# Patient Record
Sex: Female | Born: 1955 | Race: White | Hispanic: No | Marital: Single | Smoking: Never smoker
Health system: Southern US, Community
[De-identification: ages and names within clinical notes are randomized; demographics above are authoritative.]

## PROBLEM LIST (undated history)

## (undated) DIAGNOSIS — R011 Cardiac murmur, unspecified: Secondary | ICD-10-CM

## (undated) DIAGNOSIS — T7840XA Allergy, unspecified, initial encounter: Secondary | ICD-10-CM

## (undated) HISTORY — PX: COLONOSCOPY: SHX174

## (undated) HISTORY — DX: Cardiac murmur, unspecified: R01.1

## (undated) HISTORY — PX: LEEP: SHX91

## (undated) HISTORY — DX: Allergy, unspecified, initial encounter: T78.40XA

---

## 2004-03-26 ENCOUNTER — Ambulatory Visit: Payer: Self-pay | Admitting: Unknown Physician Specialty

## 2005-04-03 ENCOUNTER — Ambulatory Visit: Payer: Self-pay | Admitting: Unknown Physician Specialty

## 2006-04-15 ENCOUNTER — Ambulatory Visit: Payer: Self-pay | Admitting: Unknown Physician Specialty

## 2006-05-29 ENCOUNTER — Ambulatory Visit: Payer: Self-pay | Admitting: General Surgery

## 2007-04-20 ENCOUNTER — Ambulatory Visit: Payer: Self-pay | Admitting: Unknown Physician Specialty

## 2008-04-21 ENCOUNTER — Ambulatory Visit: Payer: Self-pay | Admitting: Unknown Physician Specialty

## 2009-05-09 ENCOUNTER — Ambulatory Visit: Payer: Self-pay | Admitting: Unknown Physician Specialty

## 2010-05-29 ENCOUNTER — Ambulatory Visit: Payer: Self-pay | Admitting: Unknown Physician Specialty

## 2010-05-30 ENCOUNTER — Ambulatory Visit: Payer: Self-pay | Admitting: Unknown Physician Specialty

## 2010-12-24 ENCOUNTER — Ambulatory Visit: Payer: Self-pay | Admitting: Unknown Physician Specialty

## 2011-08-13 ENCOUNTER — Ambulatory Visit: Payer: Self-pay | Admitting: General Surgery

## 2011-12-30 ENCOUNTER — Ambulatory Visit: Payer: Self-pay | Admitting: Family Medicine

## 2013-01-04 ENCOUNTER — Ambulatory Visit: Payer: Self-pay | Admitting: Family Medicine

## 2013-06-21 ENCOUNTER — Ambulatory Visit: Payer: Self-pay | Admitting: Family Medicine

## 2013-12-23 ENCOUNTER — Ambulatory Visit: Payer: Self-pay | Admitting: Nurse Practitioner

## 2014-02-08 ENCOUNTER — Ambulatory Visit: Payer: Self-pay | Admitting: Nurse Practitioner

## 2015-03-15 ENCOUNTER — Other Ambulatory Visit: Payer: Self-pay | Admitting: Unknown Physician Specialty

## 2017-08-27 ENCOUNTER — Other Ambulatory Visit: Payer: Self-pay | Admitting: Internal Medicine

## 2017-09-01 ENCOUNTER — Encounter: Payer: Self-pay | Admitting: Internal Medicine

## 2017-09-01 ENCOUNTER — Encounter (INDEPENDENT_AMBULATORY_CARE_PROVIDER_SITE_OTHER): Payer: Self-pay

## 2017-09-01 ENCOUNTER — Ambulatory Visit: Payer: 59 | Admitting: Internal Medicine

## 2017-09-01 VITALS — BP 114/68 | HR 69 | Resp 16 | Ht 65.0 in | Wt 146.0 lb

## 2017-09-01 DIAGNOSIS — R300 Dysuria: Secondary | ICD-10-CM

## 2017-09-01 DIAGNOSIS — Z0000 Encounter for general adult medical examination without abnormal findings: Secondary | ICD-10-CM

## 2017-09-01 DIAGNOSIS — Z119 Encounter for screening for infectious and parasitic diseases, unspecified: Secondary | ICD-10-CM | POA: Diagnosis not present

## 2017-09-01 DIAGNOSIS — M810 Age-related osteoporosis without current pathological fracture: Secondary | ICD-10-CM | POA: Diagnosis not present

## 2017-09-01 DIAGNOSIS — Z1239 Encounter for other screening for malignant neoplasm of breast: Secondary | ICD-10-CM

## 2017-09-01 DIAGNOSIS — Z1231 Encounter for screening mammogram for malignant neoplasm of breast: Secondary | ICD-10-CM | POA: Diagnosis not present

## 2017-09-01 MED ORDER — ALENDRONATE SODIUM 70 MG PO TABS: 70.0000 mg | ORAL_TABLET | ORAL | 0 refills | Status: DC

## 2017-09-01 MED ORDER — ALENDRONATE SODIUM 70 MG PO TABS: 70.0000 mg | ORAL_TABLET | ORAL | 3 refills | Status: DC

## 2017-09-01 NOTE — Progress Notes (Signed)
0

## 2017-09-01 NOTE — Progress Notes (Signed)
[facility] [address] [place], [place] [address]  Internal MEDICINE  Office Visit Note  Patient Name: Mary West  [id number]  [id number]  Date of Service: 09/01/2017  Chief Complaint  Patient presents with  . Annual Exam     HPI Pt here for routine health screening.  She denies complaints at this time.  Pt only takes fosamax, enjoys good health. She is active, and still works.   Current Medication: Outpatient Encounter Medications as of 09/01/2017  Medication Sig  . alendronate (FOSAMAX) 70 MG tablet Take 1 tablet (70 mg total) by mouth once a week. Take with a full glass of water on an empty stomach.  . [DISCONTINUED] alendronate (FOSAMAX) 70 MG tablet TAKE 1 TABLET(S) BY MOUTH ONCE A WEEK WITH EMPTY STOMACH   No facility-administered encounter medications on file as of 09/01/2017.     Surgical History: Past Surgical History:  Procedure Laterality Date  . COLONOSCOPY      Medical History: Past Medical History:  Diagnosis Date  . Allergy   . Heart murmur     Family History: Family History  Problem Relation Age of Onset  . Breast cancer Mother   . Colon cancer Mother   . COPD Father    Review of Systems  Constitutional: Negative for chills, diaphoresis and fatigue.  HENT: Negative for ear pain, postnasal drip and sinus pressure.   Eyes: Negative for photophobia, discharge, redness, itching and visual disturbance.  Respiratory: Negative for cough, shortness of breath and wheezing.   Cardiovascular: Negative for chest pain, palpitations and leg swelling.  Gastrointestinal: Negative for abdominal pain, constipation, diarrhea, nausea and vomiting.  Genitourinary: Negative for dysuria and flank pain.  Musculoskeletal: Negative for arthralgias, back pain, gait problem and neck pain.  Skin: Negative for color change.  Allergic/Immunologic: Negative for environmental allergies and food allergies.  Neurological: Negative for dizziness and  headaches.  Hematological: Does not bruise/bleed easily.  Psychiatric/Behavioral: Negative for agitation, behavioral problems (depression) and hallucinations.   Vital Signs: BP 114/68   Pulse 69   Resp 16   Ht 5\' 5"  (1.651 m)   Wt 146 lb (66.2 kg)   SpO2 98%   BMI 24.30 kg/m  Physical Exam  Constitutional: She is oriented to person, place, and time. She appears well-developed and well-nourished. No distress.  HENT:  Head: Normocephalic and atraumatic.  Mouth/Throat: Oropharynx is clear and moist. No oropharyngeal exudate.  Eyes: Pupils are equal, round, and reactive to light. EOM are normal.  Neck: Normal range of motion. Neck supple. No JVD present. No tracheal deviation present. No thyromegaly present.  Cardiovascular: Normal rate, regular rhythm and normal heart sounds. Exam reveals no gallop and no friction rub.  No murmur heard. Pulmonary/Chest: Effort normal. No respiratory distress. She has no wheezes. She has no rales. She exhibits no tenderness. Right breast exhibits no inverted nipple and no mass. Left breast exhibits no inverted nipple and no mass. Breasts are symmetrical.  Abdominal: Soft. Bowel sounds are normal.  Musculoskeletal: Normal range of motion.  Lymphadenopathy:    She has no cervical adenopathy.  Neurological: She is alert and oriented to person, place, and time. No cranial nerve deficit.  Skin: Skin is warm and dry. She is not diaphoretic.  Psychiatric: She has a normal mood and affect. Her behavior is normal. Judgment and thought content normal.   Assessment/Plan: 1. Annual physical exam Update labs, bone density and mammogram.  - CBC with Differential/Platelet - Lipid Panel With LDL/HDL Ratio -  TSH - T4, free - Comprehensive metabolic panel  2. Dysuria - UA/M w/rflx Culture, Routine  3. Breast cancer screening - MM 3D SCREEN BREAST BILATERAL; Future  4. Senile osteoporosis Continue fosomax as before.  - DG Bone Density; Future  5. Screening  examination for infectious disease - HCV RNA quant  General Counseling: Nettie ElmSylvia verbalizes understanding of the findings of todays visit and agrees with plan of treatment. I have discussed any further diagnostic evaluation that may be needed or ordered today. We also reviewed her medications today. she has been encouraged to call the office with any questions or concerns that should arise related to todays visit.    Orders Placed This Encounter  Procedures  . DG Bone Density  . MM 3D SCREEN BREAST BILATERAL  . UA/M w/rflx Culture, Routine  . CBC with Differential/Platelet  . Lipid Panel With LDL/HDL Ratio  . TSH  . T4, free  . Comprehensive metabolic panel  . HCV RNA quant    Meds ordered this encounter  Medications  . alendronate (FOSAMAX) 70 MG tablet    Sig: Take 1 tablet (70 mg total) by mouth once a week. Take with a full glass of water on an empty stomach.    Dispense:  12 tablet    Refill:  0    Time spent: 7930 Minutes  Lyndon CodeFozia M Jarone Ostergaard, MD  Internal Medicine

## 2017-09-02 LAB — SPECIMEN STATUS REPORT

## 2017-09-04 LAB — UA/M W/RFLX CULTURE, ROUTINE
BILIRUBIN UA: NEGATIVE
Glucose, UA: NEGATIVE
Ketones, UA: NEGATIVE
LEUKOCYTES UA: NEGATIVE
Nitrite, UA: NEGATIVE
PH UA: 6 (ref 5.0–7.5)
Protein, UA: NEGATIVE
RBC, UA: NEGATIVE
SPEC GRAV UA: 1.013 (ref 1.005–1.030)
Urobilinogen, Ur: 0.2 mg/dL (ref 0.2–1.0)

## 2017-09-04 LAB — COMPREHENSIVE METABOLIC PANEL
ALT: 22 IU/L (ref 0–32)
AST: 24 IU/L (ref 0–40)
Albumin/Globulin Ratio: 2 (ref 1.2–2.2)
Albumin: 4.5 g/dL (ref 3.6–4.8)
Alkaline Phosphatase: 75 IU/L (ref 39–117)
BUN/Creatinine Ratio: 10 — ABNORMAL LOW (ref 12–28)
BUN: 8 mg/dL (ref 8–27)
Bilirubin Total: 0.4 mg/dL (ref 0.0–1.2)
CALCIUM: 9.1 mg/dL (ref 8.7–10.3)
CO2: 23 mmol/L (ref 20–29)
CREATININE: 0.78 mg/dL (ref 0.57–1.00)
Chloride: 104 mmol/L (ref 96–106)
GFR calc non Af Amer: 82 mL/min/{1.73_m2} (ref >59)
GFR, EST AFRICAN AMERICAN: 95 mL/min/{1.73_m2} (ref >59)
GLUCOSE: 89 mg/dL (ref 65–99)
Globulin, Total: 2.2 g/dL (ref 1.5–4.5)
Potassium: 4.5 mmol/L (ref 3.5–5.2)
Sodium: 143 mmol/L (ref 134–144)
Total Protein: 6.7 g/dL (ref 6.0–8.5)

## 2017-09-04 LAB — CBC WITH DIFFERENTIAL/PLATELET
BASOS: 1 %
Basophils Absolute: 0 10*3/uL (ref 0.0–0.2)
EOS (ABSOLUTE): 0.1 10*3/uL (ref 0.0–0.4)
Eos: 3 %
Hematocrit: 42.7 % (ref 34.0–46.6)
Hemoglobin: 14.7 g/dL (ref 11.1–15.9)
IMMATURE GRANULOCYTES: 0 %
Immature Grans (Abs): 0 10*3/uL (ref 0.0–0.1)
Lymphocytes Absolute: 0.9 10*3/uL (ref 0.7–3.1)
Lymphs: 23 %
MCH: 33.2 pg — ABNORMAL HIGH (ref 26.6–33.0)
MCHC: 34.4 g/dL (ref 31.5–35.7)
MCV: 96 fL (ref 79–97)
MONOS ABS: 0.3 10*3/uL (ref 0.1–0.9)
Monocytes: 9 %
NEUTROS PCT: 64 %
Neutrophils Absolute: 2.4 10*3/uL (ref 1.4–7.0)
PLATELETS: 268 10*3/uL (ref 150–450)
RBC: 4.43 x10E6/uL (ref 3.77–5.28)
RDW: 12.5 % (ref 12.3–15.4)
WBC: 3.7 10*3/uL (ref 3.4–10.8)

## 2017-09-04 LAB — LIPID PANEL WITH LDL/HDL RATIO
Cholesterol, Total: 180 mg/dL (ref 100–199)
HDL: 53 mg/dL (ref >39)
LDL Calculated: 111 mg/dL — ABNORMAL HIGH (ref 0–99)
LDl/HDL Ratio: 2.1 ratio (ref 0.0–3.2)
Triglycerides: 81 mg/dL (ref 0–149)
VLDL Cholesterol Cal: 16 mg/dL (ref 5–40)

## 2017-09-04 LAB — MICROSCOPIC EXAMINATION
BACTERIA UA: NONE SEEN
Casts: NONE SEEN /lpf

## 2017-09-04 LAB — T4, FREE: FREE T4: 1.19 ng/dL (ref 0.82–1.77)

## 2017-09-04 LAB — HCV RNA QUANT: Hepatitis C Quantitation: NOT DETECTED IU/mL

## 2017-09-04 LAB — TSH: TSH: 1.38 u[IU]/mL (ref 0.450–4.500)

## 2017-09-09 NOTE — Progress Notes (Signed)
Labs look good.

## 2017-09-22 ENCOUNTER — Ambulatory Visit
Admission: RE | Admit: 2017-09-22 | Discharge: 2017-09-22 | Disposition: A | Discharge status: Home / Self Care | Payer: 59 | Source: Ambulatory Visit | Attending: Internal Medicine | Admitting: Internal Medicine

## 2017-09-22 ENCOUNTER — Encounter: Payer: Self-pay | Admitting: Radiology

## 2017-09-22 DIAGNOSIS — M810 Age-related osteoporosis without current pathological fracture: Secondary | ICD-10-CM | POA: Insufficient documentation

## 2017-09-22 DIAGNOSIS — Z1231 Encounter for screening mammogram for malignant neoplasm of breast: Secondary | ICD-10-CM | POA: Insufficient documentation

## 2017-09-22 DIAGNOSIS — Z1239 Encounter for other screening for malignant neoplasm of breast: Secondary | ICD-10-CM

## 2017-09-30 ENCOUNTER — Ambulatory Visit (INDEPENDENT_AMBULATORY_CARE_PROVIDER_SITE_OTHER): Payer: 59 | Admitting: Adult Health

## 2017-09-30 ENCOUNTER — Encounter: Payer: Self-pay | Admitting: Adult Health

## 2017-09-30 VITALS — BP 104/68 | HR 62 | Temp 98.6°F | Resp 16 | Ht 65.0 in | Wt 145.8 lb

## 2017-09-30 DIAGNOSIS — B373 Candidiasis of vulva and vagina: Secondary | ICD-10-CM

## 2017-09-30 DIAGNOSIS — B3731 Acute candidiasis of vulva and vagina: Secondary | ICD-10-CM

## 2017-09-30 DIAGNOSIS — R011 Cardiac murmur, unspecified: Secondary | ICD-10-CM | POA: Insufficient documentation

## 2017-09-30 DIAGNOSIS — N390 Urinary tract infection, site not specified: Secondary | ICD-10-CM | POA: Diagnosis not present

## 2017-09-30 DIAGNOSIS — R300 Dysuria: Secondary | ICD-10-CM

## 2017-09-30 DIAGNOSIS — J302 Other seasonal allergic rhinitis: Secondary | ICD-10-CM | POA: Insufficient documentation

## 2017-09-30 LAB — POCT URINALYSIS DIPSTICK
BILIRUBIN UA: NEGATIVE
GLUCOSE UA: NEGATIVE
KETONES UA: NEGATIVE
Nitrite, UA: NEGATIVE
Protein, UA: POSITIVE — AB
Spec Grav, UA: 1.01 (ref 1.010–1.025)
UROBILINOGEN UA: 0.2 U/dL
pH, UA: 7.5 (ref 5.0–8.0)

## 2017-09-30 MED ORDER — FLUCONAZOLE 150 MG PO TABS: 150.0000 mg | ORAL_TABLET | Freq: Every day | ORAL | 1 refills | Status: DC

## 2017-09-30 MED ORDER — NITROFURANTOIN MONOHYD MACRO 100 MG PO CAPS: 100.0000 mg | ORAL_CAPSULE | Freq: Two times a day (BID) | ORAL | 0 refills | Status: DC

## 2017-09-30 NOTE — Progress Notes (Signed)
[facility] [address] [place], [place] [address]  Internal MEDICINE  Office Visit Note  Patient Name: Mary West  [id number]  [id number]  Date of Service: 09/30/2017  Chief Complaint  Patient presents with  . Urinary Tract Infection    pressure and burning, been going on since yesterday and ongoing today     Urinary Tract Infection   This is a new problem. The current episode started yesterday. The problem has been unchanged. The quality of the pain is described as aching and burning. The pain is mild. There has been no fever. There is no history of pyelonephritis. Associated symptoms include a discharge. Pertinent negatives include no chills, flank pain, frequency, hematuria, nausea, urgency or vomiting. She has tried increased fluids for the symptoms.   Pt is here for a sick visit.   Current Medication:  Outpatient Encounter Medications as of 09/30/2017  Medication Sig  . alendronate (FOSAMAX) 70 MG tablet Take 1 tablet (70 mg total) by mouth once a week. Take with a full glass of water on an empty stomach.   No facility-administered encounter medications on file as of 09/30/2017.       Medical History: Past Medical History:  Diagnosis Date  . Allergy   . Heart murmur      Vital Signs: BP 104/68 (BP Location: Left Arm, Patient Position: Sitting, Cuff Size: Normal)   Pulse 62   Temp 98.6 F (37 C)   Resp 16   Ht 5\' 5"  (1.651 m)   Wt 145 lb 12.8 oz (66.1 kg)   SpO2 98%   BMI 24.26 kg/m    Review of Systems  Constitutional: Negative for chills, fatigue and unexpected weight change.  HENT: Negative for congestion, rhinorrhea, sneezing and sore throat.   Eyes: Negative for photophobia, pain and redness.  Respiratory: Negative for cough, chest tightness and shortness of breath.   Cardiovascular: Negative for chest pain and palpitations.  Gastrointestinal: Negative for abdominal pain, constipation, diarrhea, nausea and vomiting.   Endocrine: Negative.   Genitourinary: Negative for dysuria, flank pain, frequency, hematuria and urgency.  Musculoskeletal: Negative for arthralgias, back pain, joint swelling and neck pain.  Skin: Negative for rash.  Allergic/Immunologic: Negative.   Neurological: Negative for tremors and numbness.  Hematological: Negative for adenopathy. Does not bruise/bleed easily.  Psychiatric/Behavioral: Negative for behavioral problems and sleep disturbance. The patient is not nervous/anxious.    Physical Exam  Constitutional: She is oriented to person, place, and time. She appears well-developed and well-nourished. No distress.  HENT:  Head: Normocephalic and atraumatic.  Mouth/Throat: Oropharynx is clear and moist. No oropharyngeal exudate.  Eyes: Pupils are equal, round, and reactive to light. EOM are normal.  Neck: Normal range of motion. Neck supple. No JVD present. No tracheal deviation present. No thyromegaly present.  Cardiovascular: Normal rate, regular rhythm and normal heart sounds. Exam reveals no gallop and no friction rub.  No murmur heard. Pulmonary/Chest: Effort normal and breath sounds normal. No respiratory distress. She has no wheezes. She has no rales. She exhibits no tenderness.  Abdominal: Soft. There is no tenderness. There is no guarding.  Musculoskeletal: Normal range of motion.  Lymphadenopathy:    She has no cervical adenopathy.  Neurological: She is alert and oriented to person, place, and time. No cranial nerve deficit.  Skin: Skin is warm and dry. She is not diaphoretic.  Psychiatric: She has a normal mood and affect. Her behavior is normal. Judgment and thought content normal.  Nursing note  and vitals reviewed.  Assessment/Plan: 1. Urinary tract infection without hematuria, site unspecified - nitrofurantoin, macrocrystal-monohydrate, (MACROBID) 100 MG capsule; Take 1 capsule (100 mg total) by mouth 2 (two) times daily.  Dispense: 20 capsule; Refill: 0  2.  Vagina, candidiasis - fluconazole (DIFLUCAN) 150 MG tablet; Take 1 tablet (150 mg total) by mouth daily. May repeat in 3 days.  Dispense: 3 tablet; Refill: 1  3. Dysuria - POCT Urinalysis Dipstick - CULTURE, URINE COMPREHENSIVE  General Counseling: Mary West verbalizes understanding of the findings of todays visit and agrees with plan of treatment. I have discussed any further diagnostic evaluation that may be needed or ordered today. We also reviewed her medications today. she has been encouraged to call the office with any questions or concerns that should arise related to todays visit.   Orders Placed This Encounter  Procedures  . CULTURE, URINE COMPREHENSIVE  . POCT Urinalysis Dipstick    No orders of the defined types were placed in this encounter.   Time spent: 25 Minutes  This patient was seen by Blima LedgerAdam Kentavious Michele AGNP-C in Collaboration with Dr Lyndon CodeFozia M Khan as a part of collaborative care agreement

## 2017-09-30 NOTE — Patient Instructions (Signed)

## 2017-10-01 ENCOUNTER — Encounter: Payer: Self-pay | Admitting: Adult Health

## 2017-10-02 LAB — CULTURE, URINE COMPREHENSIVE

## 2018-09-06 ENCOUNTER — Other Ambulatory Visit: Payer: Self-pay

## 2018-09-06 ENCOUNTER — Ambulatory Visit (INDEPENDENT_AMBULATORY_CARE_PROVIDER_SITE_OTHER): Payer: 59 | Admitting: Adult Health

## 2018-09-06 ENCOUNTER — Encounter: Payer: Self-pay | Admitting: Adult Health

## 2018-09-06 VITALS — BP 103/68 | HR 76 | Resp 16 | Ht 65.0 in | Wt 140.0 lb

## 2018-09-06 DIAGNOSIS — Z124 Encounter for screening for malignant neoplasm of cervix: Secondary | ICD-10-CM | POA: Diagnosis not present

## 2018-09-06 DIAGNOSIS — M810 Age-related osteoporosis without current pathological fracture: Secondary | ICD-10-CM | POA: Diagnosis not present

## 2018-09-06 DIAGNOSIS — R300 Dysuria: Secondary | ICD-10-CM

## 2018-09-06 DIAGNOSIS — N760 Acute vaginitis: Secondary | ICD-10-CM

## 2018-09-06 DIAGNOSIS — Z0001 Encounter for general adult medical examination with abnormal findings: Secondary | ICD-10-CM

## 2018-09-06 DIAGNOSIS — Z1239 Encounter for other screening for malignant neoplasm of breast: Secondary | ICD-10-CM

## 2018-09-06 MED ORDER — ALENDRONATE SODIUM 70 MG PO TABS: 70.0000 mg | ORAL_TABLET | ORAL | 0 refills | Status: DC

## 2018-09-06 NOTE — Progress Notes (Signed)
[facility] [address] [place], [place] [address]  Internal MEDICINE  Office Visit Note  Patient Name: Mary ReefSylvia Stephens West  [id number]/02/2055  [id number]  Date of Service: 09/06/2018  Chief Complaint  Patient presents with  . Annual Exam    medicare well visit , physical with pap      HPI Pt is here for routine health maintenance examination. Pt reports overall she is doing well. She takes Fosamax every other week, and denies any other medications beyond vitamins.  No tobacco or illicit drug use.  She does drink alcohol occasionally.     Complains of vaginal discharge that is typically 3 days after sex.   Current Medication: Outpatient Encounter Medications as of 09/06/2018  Medication Sig  . alendronate (FOSAMAX) 70 MG tablet Take 1 tablet (70 mg total) by mouth once a week. Take with a full glass of water on an empty stomach.  . [DISCONTINUED] fluconazole (DIFLUCAN) 150 MG tablet Take 1 tablet (150 mg total) by mouth daily. May repeat in 3 days. (Patient not taking: Reported on 09/06/2018)  . [DISCONTINUED] nitrofurantoin, macrocrystal-monohydrate, (MACROBID) 100 MG capsule Take 1 capsule (100 mg total) by mouth 2 (two) times daily. (Patient not taking: Reported on 09/06/2018)   No facility-administered encounter medications on file as of 09/06/2018.     Surgical History: Past Surgical History:  Procedure Laterality Date  . COLONOSCOPY      Medical History: Past Medical History:  Diagnosis Date  . Allergy   . Heart murmur     Family History: Family History  Problem Relation Age of Onset  . Breast cancer Mother   . Colon cancer Mother   . COPD Father       Review of Systems  Constitutional: Negative for chills, fatigue and unexpected weight change.  HENT: Negative for congestion, rhinorrhea, sneezing and sore throat.   Eyes: Negative for photophobia, pain and redness.  Respiratory: Negative for cough, chest tightness and shortness of breath.    Cardiovascular: Negative for chest pain and palpitations.  Gastrointestinal: Negative for abdominal pain, constipation, diarrhea, nausea and vomiting.  Endocrine: Negative.   Genitourinary: Negative for dysuria and frequency.  Musculoskeletal: Negative for arthralgias, back pain, joint swelling and neck pain.  Skin: Negative for rash.  Allergic/Immunologic: Negative.   Neurological: Negative for tremors and numbness.  Hematological: Negative for adenopathy. Does not bruise/bleed easily.  Psychiatric/Behavioral: Negative for behavioral problems and sleep disturbance. The patient is not nervous/anxious.      Vital Signs: BP 103/68   Pulse 76   Resp 16   Ht 5\' 5"  (1.651 m)   Wt 140 lb (63.5 kg)   SpO2 100%   BMI 23.30 kg/m    Physical Exam Vitals signs and nursing note reviewed. Exam conducted with a chaperone present.  Constitutional:      General: She is not in acute distress.    Appearance: She is well-developed. She is not diaphoretic.  HENT:     Head: Normocephalic and atraumatic.     Mouth/Throat:     Pharynx: No oropharyngeal exudate.  Eyes:     Pupils: Pupils are equal, round, and reactive to light.  Neck:     Musculoskeletal: Normal range of motion and neck supple.     Thyroid: No thyromegaly.     Vascular: No JVD.     Trachea: No tracheal deviation.  Cardiovascular:     Rate and Rhythm: Normal rate and regular rhythm.     Heart sounds: Normal heart  sounds. No murmur. No friction rub. No gallop.   Pulmonary:     Effort: Pulmonary effort is normal. No respiratory distress.     Breath sounds: Normal breath sounds. No wheezing or rales.  Chest:     Chest wall: No tenderness.     Breasts:        Right: Normal.      Comments: Exam Chaperoned by Dorna Bloom CMA Abdominal:     Palpations: Abdomen is soft.     Tenderness: There is no abdominal tenderness. There is no guarding.  Musculoskeletal: Normal range of motion.  Lymphadenopathy:     Cervical: No  cervical adenopathy.  Skin:    General: Skin is warm and dry.  Neurological:     Mental Status: She is alert and oriented to person, place, and time.     Cranial Nerves: No cranial nerve deficit.  Psychiatric:        Behavior: Behavior normal.        Thought Content: Thought content normal.        Judgment: Judgment normal.      LABS: No results found for this or any previous visit (from the past 2160 hour(s)).  Assessment/Plan: 1. Encounter for general adult medical examination with abnormal findings Up to date on PHM. - CBC with Differential/Platelet - Lipid Panel With LDL/HDL Ratio - TSH - T4, free - Comprehensive metabolic panel  2. Senile osteoporosis Continue fosamax as prescribed.   3. Routine cervical smear - Pap IG and HPV (high risk) DNA detection  4. Acute vaginitis - NuSwab Vaginitis Plus (VG+)  5. Dysuria - Urinalysis, Routine w reflex microscopic  6. Screening for breast cancer - MM DIGITAL SCREENING BILATERAL; Future  General Counseling: tahari clabaugh understanding of the findings of todays visit and agrees with plan of treatment. I have discussed any further diagnostic evaluation that may be needed or ordered today. We also reviewed her medications today. she has been encouraged to call the office with any questions or concerns that should arise related to todays visit.   Orders Placed This Encounter  Procedures  . Urinalysis, Routine w reflex microscopic  . NuSwab Vaginitis Plus (VG+)    No orders of the defined types were placed in this encounter.   Time spent: 35 Minutes   This patient was seen by Orson Gear AGNP-C in Collaboration with Dr Lavera Guise as a part of collaborative care agreement    Kendell Bane AGNP-C Internal Medicine

## 2018-09-07 LAB — URINALYSIS, ROUTINE W REFLEX MICROSCOPIC
Bilirubin, UA: NEGATIVE
Glucose, UA: NEGATIVE
Ketones, UA: NEGATIVE
Leukocytes,UA: NEGATIVE
Nitrite, UA: NEGATIVE
Protein,UA: NEGATIVE
RBC, UA: NEGATIVE
Specific Gravity, UA: 1.019 (ref 1.005–1.030)
Urobilinogen, Ur: 0.2 mg/dL (ref 0.2–1.0)
pH, UA: 5 (ref 5.0–7.5)

## 2018-09-08 LAB — COMPREHENSIVE METABOLIC PANEL
ALT: 18 IU/L (ref 0–32)
AST: 25 IU/L (ref 0–40)
Albumin/Globulin Ratio: 2 (ref 1.2–2.2)
Albumin: 4.4 g/dL (ref 3.8–4.8)
Alkaline Phosphatase: 82 IU/L (ref 39–117)
BUN/Creatinine Ratio: 13 (ref 12–28)
BUN: 11 mg/dL (ref 8–27)
Bilirubin Total: 0.6 mg/dL (ref 0.0–1.2)
CO2: 23 mmol/L (ref 20–29)
Calcium: 9.8 mg/dL (ref 8.7–10.3)
Chloride: 104 mmol/L (ref 96–106)
Creatinine, Ser: 0.86 mg/dL (ref 0.57–1.00)
GFR calc Af Amer: 84 mL/min/{1.73_m2} (ref >59)
GFR calc non Af Amer: 73 mL/min/{1.73_m2} (ref >59)
Globulin, Total: 2.2 g/dL (ref 1.5–4.5)
Glucose: 93 mg/dL (ref 65–99)
Potassium: 4.2 mmol/L (ref 3.5–5.2)
Sodium: 142 mmol/L (ref 134–144)
Total Protein: 6.6 g/dL (ref 6.0–8.5)

## 2018-09-08 LAB — CBC WITH DIFFERENTIAL/PLATELET
Basophils Absolute: 0 10*3/uL (ref 0.0–0.2)
Basos: 1 %
EOS (ABSOLUTE): 0.1 10*3/uL (ref 0.0–0.4)
Eos: 2 %
Hematocrit: 41.1 % (ref 34.0–46.6)
Hemoglobin: 14.4 g/dL (ref 11.1–15.9)
Immature Grans (Abs): 0 10*3/uL (ref 0.0–0.1)
Immature Granulocytes: 0 %
Lymphocytes Absolute: 0.9 10*3/uL (ref 0.7–3.1)
Lymphs: 22 %
MCH: 32.9 pg (ref 26.6–33.0)
MCHC: 35 g/dL (ref 31.5–35.7)
MCV: 94 fL (ref 79–97)
Monocytes Absolute: 0.4 10*3/uL (ref 0.1–0.9)
Monocytes: 10 %
Neutrophils Absolute: 2.7 10*3/uL (ref 1.4–7.0)
Neutrophils: 65 %
Platelets: 310 10*3/uL (ref 150–450)
RBC: 4.38 x10E6/uL (ref 3.77–5.28)
RDW: 10.9 % — ABNORMAL LOW (ref 11.7–15.4)
WBC: 4.1 10*3/uL (ref 3.4–10.8)

## 2018-09-08 LAB — T4, FREE: Free T4: 1.2 ng/dL (ref 0.82–1.77)

## 2018-09-08 LAB — LIPID PANEL WITH LDL/HDL RATIO
Cholesterol, Total: 170 mg/dL (ref 100–199)
HDL: 53 mg/dL (ref >39)
LDL Calculated: 101 mg/dL — ABNORMAL HIGH (ref 0–99)
LDl/HDL Ratio: 1.9 ratio (ref 0.0–3.2)
Triglycerides: 80 mg/dL (ref 0–149)
VLDL Cholesterol Cal: 16 mg/dL (ref 5–40)

## 2018-09-08 LAB — TSH: TSH: 1.58 u[IU]/mL (ref 0.450–4.500)

## 2018-09-13 LAB — NUSWAB VAGINITIS PLUS (VG+)
Candida albicans, NAA: NEGATIVE
Candida glabrata, NAA: NEGATIVE
Chlamydia trachomatis, NAA: NEGATIVE
Neisseria gonorrhoeae, NAA: NEGATIVE
Trich vag by NAA: NEGATIVE

## 2018-09-14 ENCOUNTER — Telehealth: Payer: Self-pay

## 2018-09-14 LAB — PAP IG AND HPV HIGH-RISK: HPV, high-risk: NEGATIVE

## 2018-09-17 NOTE — Telephone Encounter (Signed)
Her labs look good, nothing to worry about.

## 2018-09-20 ENCOUNTER — Telehealth: Payer: Self-pay

## 2018-09-20 NOTE — Telephone Encounter (Signed)
Pt advised all labs look good

## 2018-09-20 NOTE — Telephone Encounter (Signed)
Pt advised pap was insufficient we can repeat pap only

## 2018-10-13 ENCOUNTER — Other Ambulatory Visit: Payer: Self-pay

## 2018-10-13 ENCOUNTER — Ambulatory Visit
Admission: RE | Admit: 2018-10-13 | Discharge: 2018-10-13 | Disposition: A | Discharge status: Home / Self Care | Payer: 59 | Source: Ambulatory Visit | Attending: Adult Health | Admitting: Adult Health

## 2018-10-13 DIAGNOSIS — Z1239 Encounter for other screening for malignant neoplasm of breast: Secondary | ICD-10-CM

## 2018-10-13 DIAGNOSIS — Z1231 Encounter for screening mammogram for malignant neoplasm of breast: Secondary | ICD-10-CM | POA: Insufficient documentation

## 2018-10-14 ENCOUNTER — Ambulatory Visit: Payer: 59 | Admitting: Nurse Practitioner

## 2018-10-14 ENCOUNTER — Encounter: Payer: Self-pay | Admitting: Nurse Practitioner

## 2018-10-14 ENCOUNTER — Other Ambulatory Visit: Payer: Self-pay | Admitting: Adult Health

## 2018-10-14 VITALS — BP 110/65 | HR 65 | Resp 16 | Ht 65.0 in | Wt 140.0 lb

## 2018-10-14 DIAGNOSIS — Z124 Encounter for screening for malignant neoplasm of cervix: Secondary | ICD-10-CM

## 2018-10-14 DIAGNOSIS — N959 Unspecified menopausal and perimenopausal disorder: Secondary | ICD-10-CM

## 2018-10-14 DIAGNOSIS — N6489 Other specified disorders of breast: Secondary | ICD-10-CM

## 2018-10-14 DIAGNOSIS — R928 Other abnormal and inconclusive findings on diagnostic imaging of breast: Secondary | ICD-10-CM

## 2018-10-14 MED ORDER — PREMARIN 0.625 MG/GM VA CREA: 1.0000 | TOPICAL_CREAM | VAGINAL | 11 refills | Status: DC

## 2018-10-14 NOTE — Progress Notes (Signed)
[facility] [address] [place], [place] [address]  Internal MEDICINE  Office Visit Note  Patient Name: Mary ReefSylvia Stephens West  [id number]/22/2057  [id number]  Date of Service: 10/17/2018  Chief Complaint  Patient presents with  . Gynecologic Exam    repeat pap    The patient is here for follow up. She had CPE with pap at her last visit. Pap was unsatisfactory for evaluation. The results were negative for HPV. She has no concerns or complaints to discuss today.       Current Medication: Outpatient Encounter Medications as of 10/14/2018  Medication Sig  . alendronate (FOSAMAX) 70 MG tablet Take 1 tablet (70 mg total) by mouth once a week. Take with a full glass of water on an empty stomach.  . calcium carbonate (CALCIUM 600) 600 MG TABS tablet Take 600 mg by mouth daily.  . Multiple Vitamins-Minerals (MULTIVITAMIN ADULTS PO) Take by mouth daily.  Marland Kitchen. conjugated estrogens (PREMARIN) vaginal cream Place 1 Applicatorful vaginally 2 (two) times a week.   No facility-administered encounter medications on file as of 10/14/2018.     Surgical History: Past Surgical History:  Procedure Laterality Date  . COLONOSCOPY      Medical History: Past Medical History:  Diagnosis Date  . Allergy   . Heart murmur     Family History: Family History  Problem Relation Age of Onset  . Colon cancer Mother   . Breast cancer Mother [id number]  . COPD Father     Social History   Socioeconomic History  . Marital status: Single    Spouse name: Not on file  . Number of children: Not on file  . Years of education: Not on file  . Highest education level: Not on file  Occupational History  . Not on file  Social Needs  . Financial resource strain: Not on file  . Food insecurity    Worry: Not on file    Inability: Not on file  . Transportation needs    Medical: Not on file    Non-medical: Not on file  Tobacco Use  . Smoking status: Never Smoker  . Smokeless tobacco: Never Used   Substance and Sexual Activity  . Alcohol use: Yes    Comment: occasionally   . Drug use: Never  . Sexual activity: Not on file  Lifestyle  . Physical activity    Days per week: Not on file    Minutes per session: Not on file  . Stress: Not on file  Relationships  . Social Musicianconnections    Talks on phone: Not on file    Gets together: Not on file    Attends religious service: Not on file    Active member of club or organization: Not on file    Attends meetings of clubs or organizations: Not on file    Relationship status: Not on file  . Intimate partner violence    Fear of current or ex partner: Not on file    Emotionally abused: Not on file    Physically abused: Not on file    Forced sexual activity: Not on file  Other Topics Concern  . Not on file  Social History Narrative  . Not on file      Review of Systems  Constitutional: Negative for chills, fatigue and unexpected weight change.  HENT: Negative for congestion, rhinorrhea, sneezing and sore throat.   Respiratory: Negative for cough, chest tightness and shortness of breath.   Cardiovascular: Negative for chest  pain and palpitations.  Gastrointestinal: Negative for abdominal pain, constipation, diarrhea, nausea and vomiting.  Genitourinary: Negative for dyspareunia, dysuria, frequency and hematuria.  Musculoskeletal: Negative for arthralgias, back pain, joint swelling and neck pain.  Skin: Negative for rash.  Allergic/Immunologic: Negative.   Neurological: Negative for tremors and numbness.  Hematological: Negative for adenopathy. Does not bruise/bleed easily.  Psychiatric/Behavioral: Negative for behavioral problems and sleep disturbance. The patient is not nervous/anxious.     Today's Vitals   10/14/18 1145  BP: 110/65  Pulse: 65  Resp: 16  SpO2: 100%  Weight: 140 lb (63.5 kg)  Height: 5\' 5"  (1.651 m)   Body mass index is 23.3 kg/m.  Physical Exam Vitals signs and nursing note reviewed.   Constitutional:      General: She is not in acute distress.    Appearance: Normal appearance. She is well-developed. She is not diaphoretic.  HENT:     Head: Normocephalic and atraumatic.     Mouth/Throat:     Pharynx: No oropharyngeal exudate.  Eyes:     Pupils: Pupils are equal, round, and reactive to light.  Neck:     Musculoskeletal: Normal range of motion and neck supple.     Thyroid: No thyromegaly.     Vascular: No JVD.     Trachea: No tracheal deviation.  Cardiovascular:     Rate and Rhythm: Normal rate and regular rhythm.     Heart sounds: Normal heart sounds. No murmur. No friction rub. No gallop.   Pulmonary:     Effort: Pulmonary effort is normal. No respiratory distress.     Breath sounds: Normal breath sounds. No wheezing or rales.  Chest:     Chest wall: No tenderness.  Abdominal:     Palpations: Abdomen is soft.     Hernia: There is no hernia in the left inguinal area or right inguinal area.  Genitourinary:    General: Normal vulva.     Exam position: Supine.     Labia:        Right: No tenderness or lesion.        Left: No tenderness or lesion.      Vagina: Normal. No vaginal discharge, tenderness or bleeding.     Cervix: No cervical motion tenderness or discharge.     Uterus: Normal.      Adnexa: Right adnexa normal.       Right: No mass or tenderness.         Left: No mass or tenderness.       Comments: No tenderness, masses, or organomeglay present during bimanual exam . Musculoskeletal: Normal range of motion.  Lymphadenopathy:     Cervical: No cervical adenopathy.     Lower Body: No right inguinal adenopathy. No left inguinal adenopathy.  Skin:    General: Skin is warm and dry.  Neurological:     Mental Status: She is alert and oriented to person, place, and time.     Cranial Nerves: No cranial nerve deficit.  Psychiatric:        Behavior: Behavior normal.        Thought Content: Thought content normal.        Judgment: Judgment normal.     Assessment/Plan: 1. Unspecified menopausal and perimenopausal disorder Premarin cream should be used up to twice weekly. Refills provided today.  - conjugated estrogens (PREMARIN) vaginal cream; Place 1 Applicatorful vaginally 2 (two) times a week.  Dispense: 42.5 g; Refill: 11  2. Routine cervical smear -  Pap IG and HPV (high risk) DNA detection  General Counseling: Mary West verbalizes understanding of the findings of todays visit and agrees with plan of treatment. I have discussed any further diagnostic evaluation that may be needed or ordered today. We also reviewed her medications today. she has been encouraged to call the office with any questions or concerns that should arise related to todays visit.  This patient was seen by [place] with Dr Lavera Guise as a part of collaborative care agreement  Meds ordered this encounter  Medications  . conjugated estrogens (PREMARIN) vaginal cream    Sig: Place 1 Applicatorful vaginally 2 (two) times a week.    Dispense:  42.5 g    Refill:  11    Please fill with generic alternative accepted by her insurance if possible. Thanks.    Order Specific Question:   Supervising Provider    Answer:   Lavera Guise [[id number]]    Time spent: 52 Minutes      Dr Lavera Guise Internal medicine

## 2018-10-17 DIAGNOSIS — N959 Unspecified menopausal and perimenopausal disorder: Secondary | ICD-10-CM | POA: Insufficient documentation

## 2018-10-17 DIAGNOSIS — Z124 Encounter for screening for malignant neoplasm of cervix: Secondary | ICD-10-CM | POA: Insufficient documentation

## 2018-10-18 LAB — PAP IG AND HPV HIGH-RISK: HPV, high-risk: NEGATIVE

## 2018-10-18 NOTE — Progress Notes (Signed)
Please let the patient know that her pap smear was normal. thanks

## 2018-10-19 ENCOUNTER — Telehealth: Payer: Self-pay

## 2018-10-19 NOTE — Telephone Encounter (Signed)
Per Nira Conn called pt to inform pt that her pap smear was normal.

## 2018-10-25 ENCOUNTER — Ambulatory Visit
Admission: RE | Admit: 2018-10-25 | Discharge: 2018-10-25 | Disposition: A | Discharge status: Home / Self Care | Payer: 59 | Source: Ambulatory Visit | Attending: Adult Health | Admitting: Adult Health

## 2018-10-25 DIAGNOSIS — N6489 Other specified disorders of breast: Secondary | ICD-10-CM

## 2018-10-25 DIAGNOSIS — R928 Other abnormal and inconclusive findings on diagnostic imaging of breast: Secondary | ICD-10-CM

## 2019-04-08 ENCOUNTER — Other Ambulatory Visit: Payer: Self-pay

## 2019-04-08 ENCOUNTER — Ambulatory Visit: Payer: 59 | Attending: Internal Medicine

## 2019-04-08 DIAGNOSIS — Z23 Encounter for immunization: Secondary | ICD-10-CM

## 2019-04-08 NOTE — Progress Notes (Signed)
   Covid-19 Vaccination Clinic  Name:  Mary West    MRN: [id number] DOB: 1955/07/09  04/08/2019  Ms. Reach was observed post Covid-19 immunization for 15 minutes without incidence. She was provided with Vaccine Information Sheet and instruction to access the V-Safe system.   [name] was instructed to call 911 with any severe reactions post vaccine: Marland Kitchen Difficulty breathing  . Swelling of your face and throat  . A fast heartbeat  . A bad rash all over your body  . Dizziness and weakness    Immunizations Administered    Name Date Dose VIS Date Route   Pfizer COVID-19 Vaccine 04/08/2019  9:05 AM 0.3 mL 02/04/2019 Intramuscular   Manufacturer: [employer], [employer]   Lot: [id number]   NDC: [address]-6

## 2019-05-03 ENCOUNTER — Ambulatory Visit: Payer: 59

## 2019-05-04 ENCOUNTER — Ambulatory Visit: Payer: 59 | Attending: Internal Medicine

## 2019-05-04 DIAGNOSIS — Z23 Encounter for immunization: Secondary | ICD-10-CM | POA: Insufficient documentation

## 2019-05-04 NOTE — Progress Notes (Signed)
   Covid-19 Vaccination Clinic  Name:  Annai Heick    MRN: [id number] DOB: September 10, 1955  05/04/2019  Ms. Fiebig was observed post Covid-19 immunization for 15 minutes without incident. She was provided with Vaccine Information Sheet and instruction to access the V-Safe system.   Ms. Minassian was instructed to call 911 with any severe reactions post vaccine: Marland Kitchen Difficulty breathing  . Swelling of face and throat  . A fast heartbeat  . A bad rash all over body  . Dizziness and weakness   Immunizations Administered    Name Date Dose VIS Date Route   Pfizer COVID-19 Vaccine 05/04/2019  8:10 AM 0.3 mL 02/04/2019 Intramuscular   Manufacturer: [employer], [employer]   Lot: [id number]   NDC: [address]-0

## 2019-08-15 ENCOUNTER — Other Ambulatory Visit: Payer: Self-pay | Admitting: Adult Health

## 2019-09-06 ENCOUNTER — Telehealth: Payer: Self-pay

## 2019-09-06 NOTE — Telephone Encounter (Signed)
CONFIRMED PATIENT APPT. -AR °

## 2019-09-08 ENCOUNTER — Other Ambulatory Visit: Payer: Self-pay

## 2019-09-08 ENCOUNTER — Other Ambulatory Visit: Payer: Self-pay | Admitting: Adult Health

## 2019-09-08 ENCOUNTER — Ambulatory Visit (INDEPENDENT_AMBULATORY_CARE_PROVIDER_SITE_OTHER): Payer: 59 | Admitting: Adult Health

## 2019-09-08 VITALS — BP 119/67 | HR 84 | Temp 97.4°F | Resp 16 | Ht 65.0 in | Wt 140.4 lb

## 2019-09-08 DIAGNOSIS — Z1231 Encounter for screening mammogram for malignant neoplasm of breast: Secondary | ICD-10-CM

## 2019-09-08 DIAGNOSIS — M810 Age-related osteoporosis without current pathological fracture: Secondary | ICD-10-CM | POA: Diagnosis not present

## 2019-09-08 DIAGNOSIS — Z0001 Encounter for general adult medical examination with abnormal findings: Secondary | ICD-10-CM | POA: Diagnosis not present

## 2019-09-08 DIAGNOSIS — R300 Dysuria: Secondary | ICD-10-CM

## 2019-09-08 NOTE — Progress Notes (Signed)
[facility] [address] [place], [place] [address]  Internal MEDICINE  Office Visit Note  Patient Name: Mary West  [id number]  [id number]  Date of Service: 09/08/2019  Chief Complaint  Patient presents with  . Medicare Wellness  . Quality Metric Gaps    tetnaus      HPI Pt is here for routine health maintenance examination.  She is doing well at this time. She has a history of  Osteoporosis. She has had some trouble with her right hip.  Orthopedics reports she has arthritis, and prescribed her Meloxicam, and she is doing well with this.      Current Medication: Outpatient Encounter Medications as of 09/08/2019  Medication Sig  . alendronate (FOSAMAX) 70 MG tablet TAKE 1 TABLET BY MOUTH ONCE A WEEK. TAKE WITH A FULL GLASS OF WATER ON AN EMPTY STOMACH.  . calcium carbonate (CALCIUM 600) 600 MG TABS tablet Take 600 mg by mouth daily.  Marland Kitchen conjugated estrogens (PREMARIN) vaginal cream Place 1 Applicatorful vaginally 2 (two) times a week.  . Multiple Vitamins-Minerals (MULTIVITAMIN ADULTS PO) Take by mouth daily.   No facility-administered encounter medications on file as of 09/08/2019.    Surgical History: Past Surgical History:  Procedure Laterality Date  . COLONOSCOPY      Medical History: Past Medical History:  Diagnosis Date  . Allergy   . Heart murmur     Family History: Family History  Problem Relation Age of Onset  . Colon cancer Mother   . Breast cancer Mother 28  . COPD Father       Review of Systems  Constitutional: Negative for chills, fatigue and unexpected weight change.  HENT: Negative for congestion, rhinorrhea, sneezing and sore throat.   Eyes: Negative for photophobia, pain and redness.  Respiratory: Negative for cough, chest tightness and shortness of breath.   Cardiovascular: Negative for chest pain and palpitations.  Gastrointestinal: Negative for abdominal pain, constipation, diarrhea, nausea and vomiting.   Endocrine: Negative.   Genitourinary: Negative for dysuria and frequency.  Musculoskeletal: Negative for arthralgias, back pain, joint swelling and neck pain.  Skin: Negative for rash.  Allergic/Immunologic: Negative.   Neurological: Negative for tremors and numbness.  Hematological: Negative for adenopathy. Does not bruise/bleed easily.  Psychiatric/Behavioral: Negative for behavioral problems and sleep disturbance. The patient is not nervous/anxious.      Vital Signs: BP 119/67   Pulse 84   Temp (!) 97.4 F (36.3 C)   Resp 16   Ht 5\' 5"  (1.651 m)   Wt 140 lb 6.4 oz (63.7 kg)   SpO2 100%   BMI 23.36 kg/m    Physical Exam Vitals and nursing note reviewed.  Constitutional:      General: She is not in acute distress.    Appearance: She is well-developed. She is not diaphoretic.  HENT:     Head: Normocephalic and atraumatic.     Mouth/Throat:     Pharynx: No oropharyngeal exudate.  Eyes:     Pupils: Pupils are equal, round, and reactive to light.  Neck:     Thyroid: No thyromegaly.     Vascular: No JVD.     Trachea: No tracheal deviation.  Cardiovascular:     Rate and Rhythm: Normal rate and regular rhythm.     Heart sounds: Normal heart sounds. No murmur heard.  No friction rub. No gallop.   Pulmonary:     Effort: Pulmonary effort is normal. No respiratory distress.     Breath  sounds: Normal breath sounds. No wheezing or rales.  Chest:     Chest wall: No tenderness.  Abdominal:     Palpations: Abdomen is soft.     Tenderness: There is no abdominal tenderness. There is no guarding.  Musculoskeletal:        General: Normal range of motion.     Cervical back: Normal range of motion and neck supple.  Lymphadenopathy:     Cervical: No cervical adenopathy.  Skin:    General: Skin is warm and dry.  Neurological:     Mental Status: She is alert and oriented to person, place, and time.     Cranial Nerves: No cranial nerve deficit.  Psychiatric:        Behavior:  Behavior normal.        Thought Content: Thought content normal.        Judgment: Judgment normal.      LABS: No results found for this or any previous visit (from the past 2160 hour(s)).    Assessment/Plan: 1. Encounter for general adult medical examination with abnormal findings Up to date on PHM, review labs when available.  - CBC with Differential/Platelet - Lipid Panel With LDL/HDL Ratio - TSH - T4, free - Comprehensive metabolic panel  2. Senile osteoporosis Continue fosamax as ordered.   3. Dysuria - Urinalysis, Routine w reflex microscopic  4. Encounter for screening mammogram for malignant neoplasm of breast - MM DIGITAL SCREENING BILATERAL; Future  General Counseling: kamile fassler understanding of the findings of todays visit and agrees with plan of treatment. I have discussed any further diagnostic evaluation that may be needed or ordered today. We also reviewed her medications today. she has been encouraged to call the office with any questions or concerns that should arise related to todays visit.   Orders Placed This Encounter  Procedures  . Urinalysis, Routine w reflex microscopic    No orders of the defined types were placed in this encounter.   Time spent: 30 Minutes   This patient was seen by Blima Ledger AGNP-C in Collaboration with Dr Lyndon Code as a part of collaborative care agreement    Johnna Acosta AGNP-C Internal Medicine

## 2019-09-09 LAB — MICROSCOPIC EXAMINATION: Casts: NONE SEEN /lpf

## 2019-09-09 LAB — URINALYSIS, ROUTINE W REFLEX MICROSCOPIC
Bilirubin, UA: NEGATIVE
Glucose, UA: NEGATIVE
Ketones, UA: NEGATIVE
Nitrite, UA: NEGATIVE
Protein,UA: NEGATIVE
RBC, UA: NEGATIVE
Specific Gravity, UA: 1.012 (ref 1.005–1.030)
Urobilinogen, Ur: 0.2 mg/dL (ref 0.2–1.0)
pH, UA: 7 (ref 5.0–7.5)

## 2019-09-10 LAB — COMPREHENSIVE METABOLIC PANEL
ALT: 22 IU/L (ref 0–32)
AST: 28 IU/L (ref 0–40)
Albumin/Globulin Ratio: 2 (ref 1.2–2.2)
Albumin: 4.7 g/dL (ref 3.8–4.8)
Alkaline Phosphatase: 94 IU/L (ref 48–121)
BUN/Creatinine Ratio: 10 — ABNORMAL LOW (ref 12–28)
BUN: 8 mg/dL (ref 8–27)
Bilirubin Total: 0.7 mg/dL (ref 0.0–1.2)
CO2: 23 mmol/L (ref 20–29)
Calcium: 9.2 mg/dL (ref 8.7–10.3)
Chloride: 102 mmol/L (ref 96–106)
Creatinine, Ser: 0.8 mg/dL (ref 0.57–1.00)
GFR calc Af Amer: 91 mL/min/{1.73_m2} (ref >59)
GFR calc non Af Amer: 79 mL/min/{1.73_m2} (ref >59)
Globulin, Total: 2.3 g/dL (ref 1.5–4.5)
Glucose: 91 mg/dL (ref 65–99)
Potassium: 4.2 mmol/L (ref 3.5–5.2)
Sodium: 137 mmol/L (ref 134–144)
Total Protein: 7 g/dL (ref 6.0–8.5)

## 2019-09-10 LAB — CBC WITH DIFFERENTIAL/PLATELET
Basophils Absolute: 0 10*3/uL (ref 0.0–0.2)
Basos: 1 %
EOS (ABSOLUTE): 0.1 10*3/uL (ref 0.0–0.4)
Eos: 2 %
Hematocrit: 42.1 % (ref 34.0–46.6)
Hemoglobin: 14.7 g/dL (ref 11.1–15.9)
Immature Grans (Abs): 0 10*3/uL (ref 0.0–0.1)
Immature Granulocytes: 0 %
Lymphocytes Absolute: 0.9 10*3/uL (ref 0.7–3.1)
Lymphs: 24 %
MCH: 33.4 pg — ABNORMAL HIGH (ref 26.6–33.0)
MCHC: 34.9 g/dL (ref 31.5–35.7)
MCV: 96 fL (ref 79–97)
Monocytes Absolute: 0.4 10*3/uL (ref 0.1–0.9)
Monocytes: 9 %
Neutrophils Absolute: 2.4 10*3/uL (ref 1.4–7.0)
Neutrophils: 64 %
Platelets: 298 10*3/uL (ref 150–450)
RBC: 4.4 x10E6/uL (ref 3.77–5.28)
RDW: 11.2 % — ABNORMAL LOW (ref 11.7–15.4)
WBC: 3.7 10*3/uL (ref 3.4–10.8)

## 2019-09-10 LAB — LIPID PANEL WITH LDL/HDL RATIO
Cholesterol, Total: 182 mg/dL (ref 100–199)
HDL: 58 mg/dL (ref >39)
LDL Chol Calc (NIH): 110 mg/dL — ABNORMAL HIGH (ref 0–99)
LDL/HDL Ratio: 1.9 ratio (ref 0.0–3.2)
Triglycerides: 78 mg/dL (ref 0–149)
VLDL Cholesterol Cal: 14 mg/dL (ref 5–40)

## 2019-09-10 LAB — TSH: TSH: 1.24 u[IU]/mL (ref 0.450–4.500)

## 2019-09-10 LAB — T4, FREE: Free T4: 1.2 ng/dL (ref 0.82–1.77)

## 2019-10-18 ENCOUNTER — Other Ambulatory Visit: Payer: Self-pay

## 2019-10-18 ENCOUNTER — Ambulatory Visit
Admission: RE | Admit: 2019-10-18 | Discharge: 2019-10-18 | Disposition: A | Discharge status: Home / Self Care | Payer: 59 | Source: Ambulatory Visit | Attending: Adult Health | Admitting: Adult Health

## 2019-10-18 ENCOUNTER — Encounter: Payer: Self-pay | Admitting: Internal Medicine

## 2019-10-18 DIAGNOSIS — Z1231 Encounter for screening mammogram for malignant neoplasm of breast: Secondary | ICD-10-CM | POA: Diagnosis present

## 2019-10-19 NOTE — Telephone Encounter (Signed)
Please see this

## 2019-11-06 ENCOUNTER — Other Ambulatory Visit: Payer: Self-pay | Admitting: Internal Medicine

## 2020-01-27 ENCOUNTER — Other Ambulatory Visit: Payer: Self-pay | Admitting: Internal Medicine

## 2020-01-27 NOTE — Telephone Encounter (Signed)
PT need appt for further refills

## 2020-09-11 ENCOUNTER — Other Ambulatory Visit: Payer: Self-pay

## 2020-09-11 ENCOUNTER — Ambulatory Visit (INDEPENDENT_AMBULATORY_CARE_PROVIDER_SITE_OTHER): Payer: 59 | Admitting: Physician Assistant

## 2020-09-11 ENCOUNTER — Encounter: Payer: Self-pay | Admitting: Physician Assistant

## 2020-09-11 DIAGNOSIS — Z1211 Encounter for screening for malignant neoplasm of colon: Secondary | ICD-10-CM | POA: Diagnosis not present

## 2020-09-11 DIAGNOSIS — Z1231 Encounter for screening mammogram for malignant neoplasm of breast: Secondary | ICD-10-CM | POA: Diagnosis not present

## 2020-09-11 DIAGNOSIS — Z0001 Encounter for general adult medical examination with abnormal findings: Secondary | ICD-10-CM | POA: Diagnosis not present

## 2020-09-11 DIAGNOSIS — R300 Dysuria: Secondary | ICD-10-CM

## 2020-09-11 DIAGNOSIS — N898 Other specified noninflammatory disorders of vagina: Secondary | ICD-10-CM | POA: Diagnosis not present

## 2020-09-11 DIAGNOSIS — Z1212 Encounter for screening for malignant neoplasm of rectum: Secondary | ICD-10-CM

## 2020-09-11 DIAGNOSIS — R5383 Other fatigue: Secondary | ICD-10-CM

## 2020-09-11 DIAGNOSIS — M810 Age-related osteoporosis without current pathological fracture: Secondary | ICD-10-CM | POA: Diagnosis not present

## 2020-09-11 MED ORDER — ZOSTER VAC RECOMB ADJUVANTED 50 MCG/0.5ML IM SUSR: 0.5000 mL | Freq: Once | INTRAMUSCULAR | 0 refills | Status: AC

## 2020-09-11 MED ORDER — ALENDRONATE SODIUM 70 MG PO TABS: ORAL_TABLET | ORAL | 0 refills | Status: DC

## 2020-09-11 MED ORDER — TETANUS-DIPHTH-ACELL PERTUSSIS 5-2.5-18.5 LF-MCG/0.5 IM SUSY: 0.5000 mL | PREFILLED_SYRINGE | Freq: Once | INTRAMUSCULAR | 0 refills | Status: AC

## 2020-09-11 NOTE — Progress Notes (Signed)
[facility] [address] [place], [place] [address]  Internal MEDICINE  Office Visit Note  Patient Name: Mary West  [id number]  [id number]  Date of Service: 09/11/2020  Chief Complaint  Patient presents with   Medicare Wellness    Patient is having a discharge, pt states there no itching or burn when urinating,patient signs started last night     HPI Pt is here for routine health maintenance examination -Some vaginal discharge starting last night, no odor that she can tell, no itching at this point and no burning. Will try OTC treatment for yeast infection while we run urine. No abdominal or flank pain. Discussed that if still going on we can do a pelvic exam in future. Pap done in 2020 was normal. Will send urine today. -Takes fosamax every other week, BMD in 2019--will repeat next year -Right hip pain and low back pain, was evaluated at [facility] clinic a year ago and told she has arthritis. She is going to follow back up with them since it is bothersome and may need intervention eventually. States she is worried her gait has changed a little due to this, but the pain is manageable currently. -Due for mammogram and routine fasting labs. Had colonoscopy in 2014 and states she was told to repeat after 17yr due to Fhx but did not have this done, is willing to repeat this now. -Will get tdap and shingles vaccines  Current Medication: Outpatient Encounter Medications as of 09/11/2020  Medication Sig   alendronate (FOSAMAX) 70 MG tablet TAKE 1 TABLET BY MOUTH ONCE A WEEK. TAKE WITH A FULL GLASS OF WATER ON AN EMPTY STOMACH.   calcium carbonate (OS-CAL) 600 MG TABS tablet Take 600 mg by mouth daily.   Multiple Vitamins-Minerals (MULTIVITAMIN ADULTS PO) Take by mouth daily.   [DISCONTINUED] alendronate (FOSAMAX) 70 MG tablet TAKE 1 TABLET BY MOUTH ONCE A WEEK. TAKE WITH A FULL GLASS OF WATER ON AN EMPTY STOMACH.   [DISCONTINUED] Tdap (BOOSTRIX) 5-2.5-18.5 LF-MCG/0.5  injection Inject 0.5 mLs into the muscle once.   [DISCONTINUED] Zoster Vaccine Adjuvanted [facility]) injection Inject 0.5 mLs into the muscle once.   conjugated estrogens (PREMARIN) vaginal cream Place 1 Applicatorful vaginally 2 (two) times a week. (Patient not taking: Reported on 09/11/2020)   No facility-administered encounter medications on file as of 09/11/2020.    Surgical History: Past Surgical History:  Procedure Laterality Date   COLONOSCOPY      Medical History: Past Medical History:  Diagnosis Date   Allergy    Heart murmur     Family History: Family History  Problem Relation Age of Onset   Colon cancer Mother    Breast cancer Mother 69   COPD Father       Review of Systems  Constitutional:  Negative for chills, fatigue and unexpected weight change.  HENT:  Negative for congestion, postnasal drip, rhinorrhea, sneezing and sore throat.   Eyes:  Negative for redness.  Respiratory:  Negative for cough, chest tightness and shortness of breath.   Cardiovascular:  Negative for chest pain and palpitations.  Gastrointestinal:  Negative for abdominal pain, constipation, diarrhea, nausea and vomiting.  Genitourinary:  Positive for vaginal discharge. Negative for difficulty urinating, dysuria, flank pain, frequency, hematuria, pelvic pain, urgency and vaginal bleeding.  Musculoskeletal:  Positive for arthralgias and back pain. Negative for joint swelling and neck pain.  Skin:  Negative for rash.  Neurological: Negative.  Negative for tremors and numbness.  Hematological:  Negative for adenopathy.  Does not bruise/bleed easily.  Psychiatric/Behavioral:  Negative for behavioral problems (Depression), sleep disturbance and suicidal ideas. The patient is not nervous/anxious.     Vital Signs: BP 108/74   Pulse 74   Temp 97.6 F (36.4 C)   Resp 16   Ht 5\' 5"  (1.651 m)   Wt 141 lb 9.6 oz (64.2 kg)   SpO2 99%   BMI 23.56 kg/m    Physical Exam Vitals and nursing note  reviewed.  Constitutional:      General: She is not in acute distress.    Appearance: She is well-developed and normal weight. She is not diaphoretic.  HENT:     Head: Normocephalic and atraumatic.     Right Ear: External ear normal.     Left Ear: External ear normal.     Nose: Nose normal.     Mouth/Throat:     Pharynx: No oropharyngeal exudate.  Eyes:     General: No scleral icterus.       Right eye: No discharge.        Left eye: No discharge.     Conjunctiva/sclera: Conjunctivae normal.     Pupils: Pupils are equal, round, and reactive to light.  Neck:     Thyroid: No thyromegaly.     Vascular: No JVD.     Trachea: No tracheal deviation.  Cardiovascular:     Rate and Rhythm: Normal rate and regular rhythm.     Heart sounds: Normal heart sounds. No murmur heard.   No friction rub. No gallop.  Pulmonary:     Effort: Pulmonary effort is normal. No respiratory distress.     Breath sounds: Normal breath sounds. No stridor. No wheezing or rales.  Chest:     Chest wall: No tenderness.  Abdominal:     General: Bowel sounds are normal. There is no distension.     Palpations: Abdomen is soft. There is no mass.     Tenderness: There is no abdominal tenderness. There is no guarding or rebound.  Musculoskeletal:        General: No tenderness or deformity. Normal range of motion.     Cervical back: Normal range of motion and neck supple.     Right lower leg: No edema.     Left lower leg: No edema.  Lymphadenopathy:     Cervical: No cervical adenopathy.  Skin:    General: Skin is warm and dry.     Coloration: Skin is not pale.     Findings: No erythema or rash.  Neurological:     Mental Status: She is alert.     Cranial Nerves: No cranial nerve deficit.     Motor: No abnormal muscle tone.     Coordination: Coordination normal.     Deep Tendon Reflexes: Reflexes are normal and symmetric.  Psychiatric:        Behavior: Behavior normal.        Thought Content: Thought content  normal.        Judgment: Judgment normal.     LABS: No results found for this or any previous visit (from the past 2160 hour(s)).      Assessment/Plan: 1. Encounter for general adult medical examination with abnormal findings CPE performed, due for colonoscopy and mammogram. BMD in 2019, will repeat next year. Due for routine fasting labs. Pap UTD.  2. Vaginal discharge Will try OTC treatment for yeast infection and will check urine for UTI/STD. Pt will f/u if worsening/not improving and consider  pelvic exam - Chlamydia/Gonococcus/Trichomonas, NAA  3. Visit for screening mammogram - MM 3D SCREEN BREAST BILATERAL; Future  4. Screening for colorectal cancer - Ambulatory referral to Gastroenterology  5. Senile osteoporosis - alendronate (FOSAMAX) 70 MG tablet; TAKE 1 TABLET BY MOUTH ONCE A WEEK. TAKE WITH A FULL GLASS OF WATER ON AN EMPTY STOMACH.  Dispense: 12 tablet; Refill: 0  6. Other fatigue - Comprehensive metabolic panel - CBC w/Diff/Platelet - TSH + free T4 - Lipid Panel With LDL/HDL Ratio  7. Dysuria - UA/M w/rflx Culture, Routine   General Counseling: Nilza verbalizes understanding of the findings of todays visit and agrees with plan of treatment. I have discussed any further diagnostic evaluation that may be needed or ordered today. We also reviewed her medications today. she has been encouraged to call the office with any questions or concerns that should arise related to todays visit.    Counseling:    Orders Placed This Encounter  Procedures   Chlamydia/Gonococcus/Trichomonas, NAA   MM 3D SCREEN BREAST BILATERAL   Comprehensive metabolic panel   CBC w/Diff/Platelet   TSH + free T4   Lipid Panel With LDL/HDL Ratio   UA/M w/rflx Culture, Routine   Ambulatory referral to Gastroenterology     Meds ordered this encounter  Medications   alendronate (FOSAMAX) 70 MG tablet    Sig: TAKE 1 TABLET BY MOUTH ONCE A WEEK. TAKE WITH A FULL GLASS OF WATER ON  AN EMPTY STOMACH.    Dispense:  12 tablet    Refill:  0     This patient was seen by Lynn Ito, PA-C in collaboration with Dr. Beverely Risen as a part of collaborative care agreement.  Total time spent:35 Minutes  Time spent includes review of chart, medications, test results, and follow up plan with the patient.     Lyndon Code, MD  Internal Medicine

## 2020-09-13 ENCOUNTER — Other Ambulatory Visit: Payer: Self-pay | Admitting: Physician Assistant

## 2020-09-13 ENCOUNTER — Encounter: Payer: Self-pay | Admitting: Physician Assistant

## 2020-09-13 DIAGNOSIS — N3000 Acute cystitis without hematuria: Secondary | ICD-10-CM

## 2020-09-13 LAB — COMPREHENSIVE METABOLIC PANEL
ALT: 18 IU/L (ref 0–32)
AST: 24 IU/L (ref 0–40)
Albumin/Globulin Ratio: 2.2 (ref 1.2–2.2)
Albumin: 4.6 g/dL (ref 3.8–4.8)
Alkaline Phosphatase: 89 IU/L (ref 44–121)
BUN/Creatinine Ratio: 11 — ABNORMAL LOW (ref 12–28)
BUN: 9 mg/dL (ref 8–27)
Bilirubin Total: 0.6 mg/dL (ref 0.0–1.2)
CO2: 21 mmol/L (ref 20–29)
Calcium: 9.5 mg/dL (ref 8.7–10.3)
Chloride: 105 mmol/L (ref 96–106)
Creatinine, Ser: 0.82 mg/dL (ref 0.57–1.00)
Globulin, Total: 2.1 g/dL (ref 1.5–4.5)
Glucose: 96 mg/dL (ref 65–99)
Potassium: 5.2 mmol/L (ref 3.5–5.2)
Sodium: 142 mmol/L (ref 134–144)
Total Protein: 6.7 g/dL (ref 6.0–8.5)
eGFR: 80 mL/min/{1.73_m2} (ref >59)

## 2020-09-13 LAB — CBC WITH DIFFERENTIAL/PLATELET
Basophils Absolute: 0 10*3/uL (ref 0.0–0.2)
Basos: 1 %
EOS (ABSOLUTE): 0.1 10*3/uL (ref 0.0–0.4)
Eos: 2 %
Hematocrit: 41.5 % (ref 34.0–46.6)
Hemoglobin: 14.3 g/dL (ref 11.1–15.9)
Immature Grans (Abs): 0 10*3/uL (ref 0.0–0.1)
Immature Granulocytes: 0 %
Lymphocytes Absolute: 1 10*3/uL (ref 0.7–3.1)
Lymphs: 22 %
MCH: 32.7 pg (ref 26.6–33.0)
MCHC: 34.5 g/dL (ref 31.5–35.7)
MCV: 95 fL (ref 79–97)
Monocytes Absolute: 0.4 10*3/uL (ref 0.1–0.9)
Monocytes: 9 %
Neutrophils Absolute: 2.8 10*3/uL (ref 1.4–7.0)
Neutrophils: 66 %
Platelets: 305 10*3/uL (ref 150–450)
RBC: 4.37 x10E6/uL (ref 3.77–5.28)
RDW: 11.1 % — ABNORMAL LOW (ref 11.7–15.4)
WBC: 4.3 10*3/uL (ref 3.4–10.8)

## 2020-09-13 LAB — LIPID PANEL WITH LDL/HDL RATIO
Cholesterol, Total: 179 mg/dL (ref 100–199)
HDL: 57 mg/dL (ref >39)
LDL Chol Calc (NIH): 109 mg/dL — ABNORMAL HIGH (ref 0–99)
LDL/HDL Ratio: 1.9 ratio (ref 0.0–3.2)
Triglycerides: 67 mg/dL (ref 0–149)
VLDL Cholesterol Cal: 13 mg/dL (ref 5–40)

## 2020-09-13 LAB — TSH+FREE T4
Free T4: 1.16 ng/dL (ref 0.82–1.77)
TSH: 1.55 u[IU]/mL (ref 0.450–4.500)

## 2020-09-13 MED ORDER — NITROFURANTOIN MONOHYD MACRO 100 MG PO CAPS: 100.0000 mg | ORAL_CAPSULE | Freq: Two times a day (BID) | ORAL | 0 refills | Status: DC

## 2020-09-14 LAB — UA/M W/RFLX CULTURE, ROUTINE
Bilirubin, UA: NEGATIVE
Glucose, UA: NEGATIVE
Ketones, UA: NEGATIVE
Nitrite, UA: NEGATIVE
Protein,UA: NEGATIVE
Specific Gravity, UA: 1.005 (ref 1.005–1.030)
Urobilinogen, Ur: 0.2 mg/dL (ref 0.2–1.0)
pH, UA: 6.5 (ref 5.0–7.5)

## 2020-09-14 LAB — URINE CULTURE, REFLEX

## 2020-09-14 LAB — MICROSCOPIC EXAMINATION
Bacteria, UA: NONE SEEN
Casts: NONE SEEN /lpf
Epithelial Cells (non renal): NONE SEEN /hpf (ref 0–10)
RBC, Urine: NONE SEEN /hpf (ref 0–2)

## 2020-09-17 LAB — CHLAMYDIA/GONOCOCCUS/TRICHOMONAS, NAA: Trich vag by NAA: NEGATIVE

## 2020-09-18 ENCOUNTER — Encounter: Payer: Self-pay | Admitting: Physician Assistant

## 2020-10-15 ENCOUNTER — Other Ambulatory Visit: Payer: Self-pay

## 2020-10-15 ENCOUNTER — Telehealth: Payer: Self-pay | Admitting: Gastroenterology

## 2020-10-15 ENCOUNTER — Telehealth: Payer: Self-pay

## 2020-10-15 DIAGNOSIS — Z1211 Encounter for screening for malignant neoplasm of colon: Secondary | ICD-10-CM

## 2020-10-15 MED ORDER — CLENPIQ 10-3.5-12 MG-GM -GM/160ML PO SOLN: 320.0000 mL | Freq: Once | ORAL | 0 refills | Status: AC

## 2020-10-15 NOTE — Telephone Encounter (Signed)
Gastroenterology Pre-Procedure Review  Request Date: 12/10/2020 Requesting Physician: Dr. Allegra Lai   PATIENT REVIEW QUESTIONS: The patient responded to the following health history questions as indicated:    1. Are you having any GI issues? no 2. Do you have a personal history of Polyps? no 3. Do you have a family history of Colon Cancer or Polyps?  Mother Colon polyps  4. Diabetes Mellitus? no 5. Joint replacements in the past 12 months?no 6. Major health problems in the past 3 months?no 7. Any artificial heart valves, MVP, or defibrillator?no    MEDICATIONS & ALLERGIES:    Patient reports the following regarding taking any anticoagulation/antiplatelet therapy:   Plavix, Coumadin, Eliquis, Xarelto, Lovenox, Pradaxa, Brilinta, or Effient? no Aspirin? No   Patient confirms/reports the following medications:  Current Outpatient Medications  Medication Sig Dispense Refill   alendronate (FOSAMAX) 70 MG tablet TAKE 1 TABLET BY MOUTH ONCE A WEEK. TAKE WITH A FULL GLASS OF WATER ON AN EMPTY STOMACH. 12 tablet 0   calcium carbonate (OS-CAL) 600 MG TABS tablet Take 600 mg by mouth daily.     conjugated estrogens (PREMARIN) vaginal cream Place 1 Applicatorful vaginally 2 (two) times a week. (Patient not taking: Reported on 09/11/2020) 42.5 g 11   Multiple Vitamins-Minerals (MULTIVITAMIN ADULTS PO) Take by mouth daily.     nitrofurantoin, macrocrystal-monohydrate, (MACROBID) 100 MG capsule Take 1 capsule (100 mg total) by mouth 2 (two) times daily. 20 capsule 0   No current facility-administered medications for this visit.    Patient confirms/reports the following allergies:  No Known Allergies  No orders of the defined types were placed in this encounter.   AUTHORIZATION INFORMATION Primary Insurance: 1D#: Group #:  Secondary Insurance: 1D#: Group #:  SCHEDULE INFORMATION: Date:  Time: Location:

## 2020-10-15 NOTE — Telephone Encounter (Signed)
Patient wants to discuss procedure dates with CMA.Clinical staff will follow up with patient.

## 2020-10-15 NOTE — Telephone Encounter (Signed)
Called and left a message for call back  

## 2020-10-18 ENCOUNTER — Ambulatory Visit
Admission: RE | Admit: 2020-10-18 | Discharge: 2020-10-18 | Disposition: A | Discharge status: Home / Self Care | Payer: 59 | Source: Ambulatory Visit | Attending: Physician Assistant | Admitting: Physician Assistant

## 2020-10-18 ENCOUNTER — Other Ambulatory Visit: Payer: Self-pay

## 2020-10-18 DIAGNOSIS — M810 Age-related osteoporosis without current pathological fracture: Secondary | ICD-10-CM | POA: Diagnosis present

## 2020-10-18 DIAGNOSIS — Z1211 Encounter for screening for malignant neoplasm of colon: Secondary | ICD-10-CM | POA: Insufficient documentation

## 2020-10-18 DIAGNOSIS — R300 Dysuria: Secondary | ICD-10-CM | POA: Diagnosis present

## 2020-10-18 DIAGNOSIS — Z0001 Encounter for general adult medical examination with abnormal findings: Secondary | ICD-10-CM | POA: Insufficient documentation

## 2020-10-18 DIAGNOSIS — R5383 Other fatigue: Secondary | ICD-10-CM | POA: Diagnosis present

## 2020-10-18 DIAGNOSIS — Z1212 Encounter for screening for malignant neoplasm of rectum: Secondary | ICD-10-CM | POA: Insufficient documentation

## 2020-10-18 DIAGNOSIS — N898 Other specified noninflammatory disorders of vagina: Secondary | ICD-10-CM | POA: Diagnosis present

## 2020-10-18 DIAGNOSIS — Z1231 Encounter for screening mammogram for malignant neoplasm of breast: Secondary | ICD-10-CM | POA: Diagnosis present

## 2020-11-28 ENCOUNTER — Encounter: Payer: Self-pay | Admitting: Gastroenterology

## 2020-12-14 ENCOUNTER — Other Ambulatory Visit: Payer: Self-pay | Admitting: Physician Assistant

## 2020-12-14 DIAGNOSIS — R5383 Other fatigue: Secondary | ICD-10-CM

## 2020-12-14 DIAGNOSIS — R300 Dysuria: Secondary | ICD-10-CM

## 2020-12-14 DIAGNOSIS — Z1212 Encounter for screening for malignant neoplasm of rectum: Secondary | ICD-10-CM

## 2020-12-14 DIAGNOSIS — M810 Age-related osteoporosis without current pathological fracture: Secondary | ICD-10-CM

## 2020-12-14 DIAGNOSIS — Z0001 Encounter for general adult medical examination with abnormal findings: Secondary | ICD-10-CM

## 2020-12-14 DIAGNOSIS — Z1211 Encounter for screening for malignant neoplasm of colon: Secondary | ICD-10-CM

## 2020-12-14 DIAGNOSIS — Z1231 Encounter for screening mammogram for malignant neoplasm of breast: Secondary | ICD-10-CM

## 2020-12-14 DIAGNOSIS — N898 Other specified noninflammatory disorders of vagina: Secondary | ICD-10-CM

## 2020-12-16 ENCOUNTER — Other Ambulatory Visit: Payer: Self-pay

## 2020-12-16 DIAGNOSIS — Z1231 Encounter for screening mammogram for malignant neoplasm of breast: Secondary | ICD-10-CM

## 2020-12-16 DIAGNOSIS — Z0001 Encounter for general adult medical examination with abnormal findings: Secondary | ICD-10-CM

## 2020-12-16 DIAGNOSIS — M810 Age-related osteoporosis without current pathological fracture: Secondary | ICD-10-CM

## 2020-12-16 DIAGNOSIS — N898 Other specified noninflammatory disorders of vagina: Secondary | ICD-10-CM

## 2020-12-16 DIAGNOSIS — R5383 Other fatigue: Secondary | ICD-10-CM

## 2020-12-16 DIAGNOSIS — Z1211 Encounter for screening for malignant neoplasm of colon: Secondary | ICD-10-CM

## 2020-12-16 DIAGNOSIS — R300 Dysuria: Secondary | ICD-10-CM

## 2020-12-16 MED ORDER — ALENDRONATE SODIUM 70 MG PO TABS: ORAL_TABLET | ORAL | 3 refills | Status: DC

## 2020-12-17 ENCOUNTER — Ambulatory Visit: Payer: 59 | Admitting: Anesthesiology

## 2020-12-17 ENCOUNTER — Other Ambulatory Visit: Payer: Self-pay

## 2020-12-17 ENCOUNTER — Ambulatory Visit
Admission: RE | Admit: 2020-12-17 | Discharge: 2020-12-17 | Disposition: A | Discharge status: Home / Self Care | Payer: 59 | Attending: Gastroenterology | Admitting: Gastroenterology

## 2020-12-17 ENCOUNTER — Encounter
Admission: RE | Disposition: A | Discharge status: Home / Self Care | Payer: Self-pay | Source: Home / Self Care | Attending: Gastroenterology

## 2020-12-17 ENCOUNTER — Encounter: Payer: Self-pay | Admitting: Gastroenterology

## 2020-12-17 DIAGNOSIS — Z79899 Other long term (current) drug therapy: Secondary | ICD-10-CM | POA: Insufficient documentation

## 2020-12-17 DIAGNOSIS — Z800 Family history of malignant neoplasm of digestive organs: Secondary | ICD-10-CM | POA: Insufficient documentation

## 2020-12-17 DIAGNOSIS — Z825 Family history of asthma and other chronic lower respiratory diseases: Secondary | ICD-10-CM | POA: Diagnosis not present

## 2020-12-17 DIAGNOSIS — Z803 Family history of malignant neoplasm of breast: Secondary | ICD-10-CM | POA: Diagnosis not present

## 2020-12-17 DIAGNOSIS — Z1211 Encounter for screening for malignant neoplasm of colon: Secondary | ICD-10-CM | POA: Diagnosis not present

## 2020-12-17 DIAGNOSIS — K641 Second degree hemorrhoids: Secondary | ICD-10-CM | POA: Insufficient documentation

## 2020-12-17 HISTORY — PX: COLONOSCOPY WITH PROPOFOL: SHX5780

## 2020-12-17 SURGERY — COLONOSCOPY WITH PROPOFOL
Anesthesia: General | Site: Rectum

## 2020-12-17 MED ORDER — STERILE WATER FOR IRRIGATION IR SOLN
Status: DC | PRN
  Administered 2020-12-17: 1

## 2020-12-17 MED ORDER — PROPOFOL 10 MG/ML IV BOLUS
INTRAVENOUS | Status: DC | PRN
  Administered 2020-12-17: 80 mg via INTRAVENOUS
  Administered 2020-12-17: 30 mg via INTRAVENOUS
  Administered 2020-12-17: 20 mg via INTRAVENOUS
  Administered 2020-12-17: 30 mg via INTRAVENOUS
  Administered 2020-12-17 (×3): 20 mg via INTRAVENOUS

## 2020-12-17 MED ORDER — LIDOCAINE HCL (CARDIAC) PF 100 MG/5ML IV SOSY
PREFILLED_SYRINGE | INTRAVENOUS | Status: DC | PRN
  Administered 2020-12-17: 50 mg via INTRAVENOUS

## 2020-12-17 MED ORDER — LACTATED RINGERS IV SOLN: INTRAVENOUS | Status: DC

## 2020-12-17 MED ORDER — SODIUM CHLORIDE 0.9 % IV SOLN: INTRAVENOUS | Status: DC

## 2020-12-17 SURGICAL SUPPLY — 6 items
GOWN CVR UNV OPN BCK APRN NK (MISCELLANEOUS) ×2 IMPLANT
GOWN ISOL THUMB LOOP REG UNIV (MISCELLANEOUS) ×4
KIT PRC NS LF DISP ENDO (KITS) ×1 IMPLANT
KIT PROCEDURE OLYMPUS (KITS) ×2
MANIFOLD NEPTUNE II (INSTRUMENTS) ×2 IMPLANT
WATER STERILE IRR 250ML POUR (IV SOLUTION) ×2 IMPLANT

## 2020-12-17 NOTE — Anesthesia Procedure Notes (Signed)
Date/Time: 12/17/2020 9:54 AM Performed by: Jimmy Picket, CRNA Pre-anesthesia Checklist: Patient identified, Emergency Drugs available, Suction available, Timeout performed and Patient being monitored Patient Re-evaluated:Patient Re-evaluated prior to induction Oxygen Delivery Method: Nasal cannula Placement Confirmation: positive ETCO2

## 2020-12-17 NOTE — Anesthesia Postprocedure Evaluation (Signed)
Anesthesia Post Note  Patient: Mary West  Procedure(s) Performed: COLONOSCOPY WITH PROPOFOL (Rectum)     Patient location during evaluation: PACU Anesthesia Type: General Level of consciousness: awake and alert Pain management: pain level controlled Vital Signs Assessment: post-procedure vital signs reviewed and stable Respiratory status: spontaneous breathing, nonlabored ventilation, respiratory function stable and patient connected to nasal cannula oxygen Cardiovascular status: blood pressure returned to baseline and stable Postop Assessment: no apparent nausea or vomiting Anesthetic complications: no   No notable events documented.  Wille Celeste Roiza Wiedel

## 2020-12-17 NOTE — Op Note (Signed)
[facility] Patient Name: Mary West Procedure Date: 12/17/2020 9:47 AM MRN: [id number] Account #: [id number] Date of Birth: Sep 20, 1955 Admit Type: Outpatient Age: 65 Room: [facility] OR ROOM 01 Gender: Female Note Status: Finalized Instrument Name: [id number] Procedure:             Colonoscopy Indications:           Screening for colorectal malignant neoplasm Providers:             Midge Minium MD, MD Referring MD:          Lyndon Code, MD (Referring MD) Medicines:             Propofol per Anesthesia Complications:         No immediate complications. Procedure:             Pre-Anesthesia Assessment:                        - Prior to the procedure, a History and Physical was                         performed, and patient medications and allergies were                         reviewed. The patient's tolerance of previous                         anesthesia was also reviewed. The risks and benefits                         of the procedure and the sedation options and risks                         were discussed with the patient. All questions were                         answered, and informed consent was obtained. Prior                         Anticoagulants: The patient has taken no previous                         anticoagulant or antiplatelet agents. ASA Grade                         Assessment: II - A patient with mild systemic disease.                         After reviewing the risks and benefits, the patient                         was deemed in satisfactory condition to undergo the                         procedure.                        After obtaining informed consent, the colonoscope was  passed under direct vision. Throughout the procedure,                         the patient's blood pressure, pulse, and oxygen                         saturations were monitored continuously. The                         Colonoscope was  introduced through the anus and                         advanced to the the cecum, identified by appendiceal                         orifice and ileocecal valve. The colonoscopy was                         performed without difficulty. The patient tolerated                         the procedure well. The quality of the bowel                         preparation was excellent. Findings:      The perianal and digital rectal examinations were normal.      Non-bleeding internal hemorrhoids were found during retroflexion. The       hemorrhoids were Grade II (internal hemorrhoids that prolapse but reduce       spontaneously). Impression:            - Non-bleeding internal hemorrhoids.                        - No specimens collected. Recommendation:        - Discharge patient to home.                        - Resume previous diet.                        - Continue present medications.                        - Repeat colonoscopy in 10 years for screening                         purposes.                        - unless any change in family history or lower GI                         problems. Procedure Code(s):     --- Professional ---                        [phone], Colonoscopy, flexible; diagnostic, including                         collection of specimen(s) by brushing or washing, when  performed (separate procedure) Diagnosis Code(s):     --- Professional ---                        Z12.11, Encounter for screening for malignant neoplasm                         of colon CPT copyright 2019 [employer]. All rights reserved. The codes documented in this report are preliminary and upon coder review may  be revised to meet current compliance requirements. Midge Minium MD, MD 12/17/2020 10:11:22 AM This report has been signed electronically. Number of Addenda: 0 Note Initiated On: 12/17/2020 9:47 AM Scope Withdrawal Time: 0 hours 8 minutes 31 seconds  Total  Procedure Duration: 0 hours 11 minutes 46 seconds  Estimated Blood Loss:  Estimated blood loss: none.      [facility]

## 2020-12-17 NOTE — H&P (Signed)
Midge Minium, MD [facility] [address] [place], [place] [address] Phone:[phone] Fax : [phone]  Primary Care Physician:  Lyndon Code, MD Primary Gastroenterologist:  Dr. Servando Snare  Pre-Procedure History & Physical: HPI:  Mary West is a 65 y.o. female is here for an colonoscopy.   Past Medical History:  Diagnosis Date   Allergy    Heart murmur     Past Surgical History:  Procedure Laterality Date   COLONOSCOPY     LEEP      Prior to Admission medications   Medication Sig Start Date End Date Taking? Authorizing Provider  alendronate (FOSAMAX) 70 MG tablet TAKE 1 TABLET BY MOUTH ONCE A WEEK. TAKE WITH A FULL GLASS OF WATER ON AN EMPTY STOMACH. 12/16/20  Yes Lyndon Code, MD  calcium carbonate (OS-CAL) 600 MG TABS tablet Take 600 mg by mouth daily.   Yes [provider]  Multiple Vitamins-Minerals (MULTIVITAMIN ADULTS PO) Take by mouth daily.   Yes [provider]  conjugated estrogens (PREMARIN) vaginal cream Place 1 Applicatorful vaginally 2 (two) times a week. Patient not taking: Reported on 09/11/2020 10/14/18   Carlean Jews, NP  nitrofurantoin, macrocrystal-monohydrate, (MACROBID) 100 MG capsule Take 1 capsule (100 mg total) by mouth 2 (two) times daily. Patient not taking: Reported on 11/28/2020 09/13/20   Carlean Jews, PA-C    Allergies as of 10/15/2020   (No Known Allergies)    Family History  Problem Relation Age of Onset   Colon cancer Mother    Breast cancer Mother 60   COPD Father     Social History   Socioeconomic History   Marital status: Single    Spouse name: Not on file   Number of children: Not on file   Years of education: Not on file   Highest education level: Not on file  Occupational History   Not on file  Tobacco Use   Smoking status: Never   Smokeless tobacco: Never  Vaping Use   Vaping Use: Never used  Substance and Sexual Activity   Alcohol use: Yes    Comment: occasionally     Drug use: Never   Sexual activity: Not on file  Other Topics Concern   Not on file  Social History Narrative   Not on file   Social Determinants of Health   Financial Resource Strain: Not on file  Food Insecurity: Not on file  Transportation Needs: Not on file  Physical Activity: Not on file  Stress: Not on file  Social Connections: Not on file  Intimate Partner Violence: Not on file    Review of Systems: See HPI, otherwise negative ROS  Physical Exam: BP 106/67   Pulse 78   Temp 97.7 F (36.5 C) (Temporal)   Ht 5\' 5"  (1.651 m)   Wt 60.5 kg   SpO2 97%   BMI 22.20 kg/m  General:   Alert,  pleasant and cooperative in NAD Head:  Normocephalic and atraumatic. Neck:  Supple; no masses or thyromegaly. Lungs:  Clear throughout to auscultation.    Heart:  Regular rate and rhythm. Abdomen:  Soft, nontender and nondistended. Normal bowel sounds, without guarding, and without rebound.   Neurologic:  Alert and  oriented x4;  grossly normal neurologically.  Impression/Plan: Mary West is here for an colonoscopy to be performed for a history of adenomatous polyps on 2015  Risks, benefits, limitations, and alternatives regarding  colonoscopy have been reviewed with the patient.  Questions have  been answered.  All parties agreeable.   Midge Minium, MD  12/17/2020, 8:51 AM

## 2020-12-17 NOTE — Anesthesia Preprocedure Evaluation (Signed)
Anesthesia Evaluation  Patient identified by MRN, date of birth, ID band Patient awake    History of Anesthesia Complications Negative for: history of anesthetic complications  Airway Mallampati: II  TM Distance: >3 FB Neck ROM: Full    Dental no notable dental hx.    Pulmonary neg pulmonary ROS,    Pulmonary exam normal        Cardiovascular Exercise Tolerance: Good negative cardio ROS Normal cardiovascular exam     Neuro/Psych negative neurological ROS     GI/Hepatic negative GI ROS, Neg liver ROS,   Endo/Other  negative endocrine ROS  Renal/GU negative Renal ROS     Musculoskeletal negative musculoskeletal ROS (+)   Abdominal   Peds  Hematology negative hematology ROS (+)   Anesthesia Other Findings   Reproductive/Obstetrics                             Anesthesia Physical Anesthesia Plan  ASA: 1  Anesthesia Plan: General   Post-op Pain Management:    Induction: Intravenous  PONV Risk Score and Plan: 3 and Propofol infusion, Treatment may vary due to age or medical condition and TIVA  Airway Management Planned: Nasal Cannula and Natural Airway  Additional Equipment: None  Intra-op Plan:   Post-operative Plan:   Informed Consent: I have reviewed the patients History and Physical, chart, labs and discussed the procedure including the risks, benefits and alternatives for the proposed anesthesia with the patient or authorized representative who has indicated his/her understanding and acceptance.       Plan Discussed with: CRNA  Anesthesia Plan Comments:         Anesthesia Quick Evaluation

## 2020-12-17 NOTE — Transfer of Care (Signed)
Immediate Anesthesia Transfer of Care Note  Patient: Mary West  Procedure(s) Performed: COLONOSCOPY WITH PROPOFOL (Rectum)  Patient Location: PACU  Anesthesia Type: General  Level of Consciousness: awake, alert  and patient cooperative  Airway and Oxygen Therapy: Patient Spontanous Breathing and Patient connected to supplemental oxygen  Post-op Assessment: Post-op Vital signs reviewed, Patient's Cardiovascular Status Stable, Respiratory Function Stable, Patent Airway and No signs of Nausea or vomiting  Post-op Vital Signs: Reviewed and stable  Complications: No notable events documented.

## 2020-12-19 ENCOUNTER — Encounter: Payer: Self-pay | Admitting: Gastroenterology

## 2021-02-27 ENCOUNTER — Encounter: Payer: Self-pay | Admitting: Nurse Practitioner

## 2021-02-27 ENCOUNTER — Ambulatory Visit (INDEPENDENT_AMBULATORY_CARE_PROVIDER_SITE_OTHER): Payer: Medicare Other | Admitting: Nurse Practitioner

## 2021-02-27 ENCOUNTER — Other Ambulatory Visit: Payer: Self-pay

## 2021-02-27 VITALS — BP 106/66 | HR 105 | Temp 98.4°F | Resp 16 | Ht 65.0 in | Wt 142.2 lb

## 2021-02-27 DIAGNOSIS — N3000 Acute cystitis without hematuria: Secondary | ICD-10-CM | POA: Diagnosis not present

## 2021-02-27 DIAGNOSIS — R300 Dysuria: Secondary | ICD-10-CM

## 2021-02-27 LAB — POCT URINALYSIS DIPSTICK
Bilirubin, UA: NEGATIVE
Glucose, UA: NEGATIVE
Nitrite, UA: NEGATIVE
Protein, UA: POSITIVE — AB
Spec Grav, UA: <=1.005 — AB (ref 1.010–1.025)
Urobilinogen, UA: 0.2 E.U./dL
pH, UA: 6.5 (ref 5.0–8.0)

## 2021-02-27 MED ORDER — NITROFURANTOIN MONOHYD MACRO 100 MG PO CAPS: 100.0000 mg | ORAL_CAPSULE | Freq: Two times a day (BID) | ORAL | 0 refills | Status: AC

## 2021-02-27 NOTE — Progress Notes (Signed)
[facility] [address], [place] [address]  Internal MEDICINE  Office Visit Note  Patient Name: Mary West  [id number]  [id number]  Date of Service: 02/27/2021  Chief Complaint  Patient presents with   Acute Visit   Urinary Tract Infection    Pain when finishing stream/peeing, pelvic pain and pressure, urgency, frequency,      HPI Mary West presents for an acute sick visit for dysuria, pelvic pain and pressure, urinary frequency and urgency. She has had these symptoms for a few days. She denies any back pain or flank pain. She denies any fever or chills.      Current Medication:  Outpatient Encounter Medications as of 02/27/2021  Medication Sig   alendronate (FOSAMAX) 70 MG tablet TAKE 1 TABLET BY MOUTH ONCE A WEEK. TAKE WITH A FULL GLASS OF WATER ON AN EMPTY STOMACH.   calcium carbonate (OS-CAL) 600 MG TABS tablet Take 600 mg by mouth daily.   Multiple Vitamins-Minerals (MULTIVITAMIN ADULTS PO) Take by mouth daily.   nitrofurantoin, macrocrystal-monohydrate, (MACROBID) 100 MG capsule Take 1 capsule (100 mg total) by mouth 2 (two) times daily for 10 days. Use as instructed   [DISCONTINUED] conjugated estrogens (PREMARIN) vaginal cream Place 1 Applicatorful vaginally 2 (two) times a week. (Patient not taking: Reported on 09/11/2020)   [DISCONTINUED] nitrofurantoin, macrocrystal-monohydrate, (MACROBID) 100 MG capsule Take 1 capsule (100 mg total) by mouth 2 (two) times daily. (Patient not taking: Reported on 11/28/2020)   No facility-administered encounter medications on file as of 02/27/2021.      Medical History: Past Medical History:  Diagnosis Date   Allergy    Heart murmur      Vital Signs: BP 106/66    Pulse (!) 105    Temp 98.4 F (36.9 C)    Resp 16    Ht 5\' 5"  (1.651 m)    Wt 142 lb 3.2 oz (64.5 kg)    SpO2 98%    BMI 23.66 kg/m    Review of Systems  Constitutional:  Negative for fever.  Respiratory: Negative.    Genitourinary:   Positive for decreased urine volume, difficulty urinating, dysuria, frequency and urgency. Negative for flank pain and pelvic pain.  Musculoskeletal:  Negative for back pain.   Physical Exam Vitals reviewed.  Constitutional:      General: She is not in acute distress.    Appearance: Normal appearance. She is normal weight. She is not ill-appearing.  HENT:     Head: Normocephalic and atraumatic.  Eyes:     Pupils: Pupils are equal, round, and reactive to light.  Pulmonary:     Effort: Pulmonary effort is normal. No respiratory distress.  Abdominal:     Tenderness: There is abdominal tenderness in the suprapubic area.  Neurological:     Mental Status: She is alert and oriented to person, place, and time.  Psychiatric:        Mood and Affect: Mood normal.        Behavior: Behavior normal.      Assessment/Plan: 1. Acute cystitis without hematuria Empiric antibiotic treatment prescribed - nitrofurantoin, macrocrystal-monohydrate, (MACROBID) 100 MG capsule; Take 1 capsule (100 mg total) by mouth 2 (two) times daily for 10 days. Use as instructed  Dispense: 20 capsule; Refill: 0  2. Dysuria UA done, positive for leukocytes - POCT Urinalysis Dipstick - CULTURE, URINE COMPREHENSIVE   General Counseling: Mary West verbalizes understanding of the findings of todays visit and agrees with plan of treatment. I have  discussed any further diagnostic evaluation that may be needed or ordered today. We also reviewed her medications today. she has been encouraged to call the office with any questions or concerns that should arise related to todays visit.    Counseling:    Orders Placed This Encounter  Procedures   CULTURE, URINE COMPREHENSIVE   POCT Urinalysis Dipstick    Meds ordered this encounter  Medications   nitrofurantoin, macrocrystal-monohydrate, (MACROBID) 100 MG capsule    Sig: Take 1 capsule (100 mg total) by mouth 2 (two) times daily for 10 days. Use as instructed     Dispense:  20 capsule    Refill:  0    Return if symptoms worsen or fail to improve.  [place] Controlled Substance Database was reviewed by me for overdose risk score (ORS)  Time spent:20 Minutes Time spent with patient included reviewing progress notes, labs, imaging studies, and discussing plan for follow up.   This patient was seen by Jonetta Osgood, FNP-C in collaboration with Dr. Clayborn Bigness as a part of collaborative care agreement.  Alainah Phang R. Valetta Fuller, MSN, FNP-C Internal Medicine

## 2021-03-04 ENCOUNTER — Encounter: Payer: Self-pay | Admitting: Nurse Practitioner

## 2021-03-04 LAB — CULTURE, URINE COMPREHENSIVE

## 2021-03-29 DIAGNOSIS — M25551 Pain in right hip: Secondary | ICD-10-CM | POA: Diagnosis not present

## 2021-03-29 DIAGNOSIS — M1611 Unilateral primary osteoarthritis, right hip: Secondary | ICD-10-CM | POA: Diagnosis not present

## 2021-04-01 DIAGNOSIS — M1611 Unilateral primary osteoarthritis, right hip: Secondary | ICD-10-CM | POA: Diagnosis not present

## 2021-04-01 DIAGNOSIS — M25551 Pain in right hip: Secondary | ICD-10-CM | POA: Diagnosis not present

## 2021-04-08 DIAGNOSIS — M25551 Pain in right hip: Secondary | ICD-10-CM | POA: Diagnosis not present

## 2021-04-08 DIAGNOSIS — M1611 Unilateral primary osteoarthritis, right hip: Secondary | ICD-10-CM | POA: Diagnosis not present

## 2021-04-15 DIAGNOSIS — M1611 Unilateral primary osteoarthritis, right hip: Secondary | ICD-10-CM | POA: Diagnosis not present

## 2021-04-15 DIAGNOSIS — M25551 Pain in right hip: Secondary | ICD-10-CM | POA: Diagnosis not present

## 2021-04-17 DIAGNOSIS — D225 Melanocytic nevi of trunk: Secondary | ICD-10-CM | POA: Diagnosis not present

## 2021-04-17 DIAGNOSIS — D485 Neoplasm of uncertain behavior of skin: Secondary | ICD-10-CM | POA: Diagnosis not present

## 2021-04-25 DIAGNOSIS — M1611 Unilateral primary osteoarthritis, right hip: Secondary | ICD-10-CM | POA: Diagnosis not present

## 2021-04-25 DIAGNOSIS — M25551 Pain in right hip: Secondary | ICD-10-CM | POA: Diagnosis not present

## 2021-04-26 ENCOUNTER — Other Ambulatory Visit: Payer: Self-pay

## 2021-04-26 ENCOUNTER — Telehealth: Payer: Self-pay

## 2021-04-26 MED ORDER — NITROFURANTOIN MONOHYD MACRO 100 MG PO CAPS: 100.0000 mg | ORAL_CAPSULE | Freq: Two times a day (BID) | ORAL | 0 refills | Status: DC

## 2021-04-26 NOTE — Telephone Encounter (Signed)
Pt called that she was here last month for UTI and she having symptoms again and she is unable to come in as per lauren send macrobid for 7 days and advised her if she is not feeling better need appt  ?

## 2021-05-06 DIAGNOSIS — M1611 Unilateral primary osteoarthritis, right hip: Secondary | ICD-10-CM | POA: Diagnosis not present

## 2021-05-06 DIAGNOSIS — M25551 Pain in right hip: Secondary | ICD-10-CM | POA: Diagnosis not present

## 2021-05-08 DIAGNOSIS — D225 Melanocytic nevi of trunk: Secondary | ICD-10-CM | POA: Diagnosis not present

## 2021-05-13 DIAGNOSIS — M542 Cervicalgia: Secondary | ICD-10-CM | POA: Diagnosis not present

## 2021-05-13 DIAGNOSIS — M5412 Radiculopathy, cervical region: Secondary | ICD-10-CM | POA: Diagnosis not present

## 2021-05-13 DIAGNOSIS — M47812 Spondylosis without myelopathy or radiculopathy, cervical region: Secondary | ICD-10-CM | POA: Diagnosis not present

## 2021-05-13 DIAGNOSIS — M25551 Pain in right hip: Secondary | ICD-10-CM | POA: Diagnosis not present

## 2021-05-13 DIAGNOSIS — M1611 Unilateral primary osteoarthritis, right hip: Secondary | ICD-10-CM | POA: Diagnosis not present

## 2021-05-20 DIAGNOSIS — M1611 Unilateral primary osteoarthritis, right hip: Secondary | ICD-10-CM | POA: Diagnosis not present

## 2021-05-20 DIAGNOSIS — M25551 Pain in right hip: Secondary | ICD-10-CM | POA: Diagnosis not present

## 2021-05-27 DIAGNOSIS — M542 Cervicalgia: Secondary | ICD-10-CM | POA: Diagnosis not present

## 2021-05-30 DIAGNOSIS — M25551 Pain in right hip: Secondary | ICD-10-CM | POA: Diagnosis not present

## 2021-05-30 DIAGNOSIS — M1611 Unilateral primary osteoarthritis, right hip: Secondary | ICD-10-CM | POA: Diagnosis not present

## 2021-06-04 DIAGNOSIS — M1611 Unilateral primary osteoarthritis, right hip: Secondary | ICD-10-CM | POA: Diagnosis not present

## 2021-06-04 DIAGNOSIS — M25551 Pain in right hip: Secondary | ICD-10-CM | POA: Diagnosis not present

## 2021-06-11 DIAGNOSIS — M542 Cervicalgia: Secondary | ICD-10-CM | POA: Diagnosis not present

## 2021-06-11 DIAGNOSIS — M25551 Pain in right hip: Secondary | ICD-10-CM | POA: Diagnosis not present

## 2021-06-11 DIAGNOSIS — M1611 Unilateral primary osteoarthritis, right hip: Secondary | ICD-10-CM | POA: Diagnosis not present

## 2021-06-18 DIAGNOSIS — M1611 Unilateral primary osteoarthritis, right hip: Secondary | ICD-10-CM | POA: Diagnosis not present

## 2021-06-18 DIAGNOSIS — M25551 Pain in right hip: Secondary | ICD-10-CM | POA: Diagnosis not present

## 2021-06-24 DIAGNOSIS — M5416 Radiculopathy, lumbar region: Secondary | ICD-10-CM | POA: Diagnosis not present

## 2021-07-02 DIAGNOSIS — M542 Cervicalgia: Secondary | ICD-10-CM | POA: Diagnosis not present

## 2021-07-09 DIAGNOSIS — M542 Cervicalgia: Secondary | ICD-10-CM | POA: Diagnosis not present

## 2021-07-16 DIAGNOSIS — M542 Cervicalgia: Secondary | ICD-10-CM | POA: Diagnosis not present

## 2021-07-17 DIAGNOSIS — M47812 Spondylosis without myelopathy or radiculopathy, cervical region: Secondary | ICD-10-CM | POA: Diagnosis not present

## 2021-07-23 DIAGNOSIS — M542 Cervicalgia: Secondary | ICD-10-CM | POA: Diagnosis not present

## 2021-07-30 DIAGNOSIS — M542 Cervicalgia: Secondary | ICD-10-CM | POA: Diagnosis not present

## 2021-07-31 DIAGNOSIS — M5416 Radiculopathy, lumbar region: Secondary | ICD-10-CM | POA: Diagnosis not present

## 2021-08-13 DIAGNOSIS — M542 Cervicalgia: Secondary | ICD-10-CM | POA: Diagnosis not present

## 2021-08-16 DIAGNOSIS — M5416 Radiculopathy, lumbar region: Secondary | ICD-10-CM | POA: Diagnosis not present

## 2021-08-29 DIAGNOSIS — M5416 Radiculopathy, lumbar region: Secondary | ICD-10-CM | POA: Diagnosis not present

## 2021-08-29 DIAGNOSIS — M5136 Other intervertebral disc degeneration, lumbar region: Secondary | ICD-10-CM | POA: Diagnosis not present

## 2021-08-29 DIAGNOSIS — M48061 Spinal stenosis, lumbar region without neurogenic claudication: Secondary | ICD-10-CM | POA: Diagnosis not present

## 2021-08-29 DIAGNOSIS — M5126 Other intervertebral disc displacement, lumbar region: Secondary | ICD-10-CM | POA: Diagnosis not present

## 2021-09-09 DIAGNOSIS — M25551 Pain in right hip: Secondary | ICD-10-CM | POA: Diagnosis not present

## 2021-09-09 DIAGNOSIS — M5416 Radiculopathy, lumbar region: Secondary | ICD-10-CM | POA: Diagnosis not present

## 2021-09-09 DIAGNOSIS — M6281 Muscle weakness (generalized): Secondary | ICD-10-CM | POA: Diagnosis not present

## 2021-09-18 DIAGNOSIS — M5416 Radiculopathy, lumbar region: Secondary | ICD-10-CM | POA: Diagnosis not present

## 2021-09-19 ENCOUNTER — Encounter: Payer: Self-pay | Admitting: Physician Assistant

## 2021-09-19 ENCOUNTER — Ambulatory Visit (INDEPENDENT_AMBULATORY_CARE_PROVIDER_SITE_OTHER): Payer: Medicare Other | Admitting: Physician Assistant

## 2021-09-19 VITALS — BP 103/61 | HR 67 | Temp 98.0°F | Resp 16 | Ht 65.0 in | Wt 140.2 lb

## 2021-09-19 DIAGNOSIS — Z1231 Encounter for screening mammogram for malignant neoplasm of breast: Secondary | ICD-10-CM | POA: Diagnosis not present

## 2021-09-19 DIAGNOSIS — M810 Age-related osteoporosis without current pathological fracture: Secondary | ICD-10-CM

## 2021-09-19 DIAGNOSIS — E559 Vitamin D deficiency, unspecified: Secondary | ICD-10-CM

## 2021-09-19 DIAGNOSIS — R946 Abnormal results of thyroid function studies: Secondary | ICD-10-CM

## 2021-09-19 DIAGNOSIS — E538 Deficiency of other specified B group vitamins: Secondary | ICD-10-CM | POA: Diagnosis not present

## 2021-09-19 DIAGNOSIS — R5383 Other fatigue: Secondary | ICD-10-CM

## 2021-09-19 DIAGNOSIS — E782 Mixed hyperlipidemia: Secondary | ICD-10-CM

## 2021-09-19 DIAGNOSIS — R300 Dysuria: Secondary | ICD-10-CM | POA: Diagnosis not present

## 2021-09-19 DIAGNOSIS — Z0001 Encounter for general adult medical examination with abnormal findings: Secondary | ICD-10-CM | POA: Diagnosis not present

## 2021-09-19 MED ORDER — TETANUS-DIPHTH-ACELL PERTUSSIS 5-2.5-18.5 LF-MCG/0.5 IM SUSY: 0.5000 mL | PREFILLED_SYRINGE | Freq: Once | INTRAMUSCULAR | 0 refills | Status: AC

## 2021-09-19 MED ORDER — PNEUMOCOCCAL 20-VAL CONJ VACC 0.5 ML IM SUSY: 0.5000 mL | PREFILLED_SYRINGE | INTRAMUSCULAR | 0 refills | Status: AC

## 2021-09-19 MED ORDER — ZOSTER VAC RECOMB ADJUVANTED 50 MCG/0.5ML IM SUSR: 0.5000 mL | Freq: Once | INTRAMUSCULAR | 0 refills | Status: AC

## 2021-09-19 NOTE — Progress Notes (Signed)
[facility] [place], [place] [address]  Internal MEDICINE  Office Visit Note  Patient Name: Mary West  [id number]  [id number]  Date of Service: 09/29/2021  Chief Complaint  Patient presents with   Medicare Wellness   Urine Output    Sometimes has trouble making it to the restroom in time   Quality Metric Gaps    Pneumonia, Shingles, Tetanus     HPI Pt is here for routine health maintenance examination -Has some urge incontinence, but not at the point of wanting to try any meds. Will try kegels to strengthen pelvic floor. Will also check urine with Cpe today and treat accordingly -Going to [facility] for right hip and then back. Had injection yesterday and is going to PT for hip, back and neck as well. She is taking meloxicam as well -BP is always on the low side and no symptoms  -She is retired now and enjoying it -may move forward with shingles -will redo bone density and mammogram together -Also due for PNA, shingles, and tdap vaccines  Current Medication: Outpatient Encounter Medications as of 09/19/2021  Medication Sig   alendronate (FOSAMAX) 70 MG tablet TAKE 1 TABLET BY MOUTH ONCE A WEEK. TAKE WITH A FULL GLASS OF WATER ON AN EMPTY STOMACH.   calcium carbonate (OS-CAL) 600 MG TABS tablet Take 600 mg by mouth daily.   meloxicam (MOBIC) 7.5 MG tablet Take 7.5 mg by mouth daily.   Multiple Vitamins-Minerals (MULTIVITAMIN ADULTS PO) Take by mouth daily.   nitrofurantoin, macrocrystal-monohydrate, (MACROBID) 100 MG capsule Take 1 capsule (100 mg total) by mouth 2 (two) times daily.   [DISCONTINUED] Pneumococcal 20-Val Conj Vacc (PREVNAR 20 IM) Inject into the muscle.   [DISCONTINUED] pneumococcal 20-valent conjugate vaccine (PREVNAR 20) 0.5 ML injection Inject 0.5 mLs into the muscle tomorrow at 10 am.   [DISCONTINUED] Tdap (BOOSTRIX) 5-2.5-18.5 LF-MCG/0.5 injection Inject 0.5 mLs into the muscle once.   [DISCONTINUED] Zoster Vaccine  Adjuvanted [facility]) injection Inject 0.5 mLs into the muscle once.   [EXPIRED] pneumococcal 20-valent conjugate vaccine (PREVNAR 20) 0.5 ML injection Inject 0.5 mLs into the muscle tomorrow at 10 am for 1 dose.   [EXPIRED] Tdap (BOOSTRIX) 5-2.5-18.5 LF-MCG/0.5 injection Inject 0.5 mLs into the muscle once for 1 dose.   [EXPIRED] Zoster Vaccine Adjuvanted [facility]) injection Inject 0.5 mLs into the muscle once for 1 dose.   No facility-administered encounter medications on file as of 09/19/2021.    Surgical History: Past Surgical History:  Procedure Laterality Date   COLONOSCOPY     COLONOSCOPY WITH PROPOFOL N/A 12/17/2020   Procedure: COLONOSCOPY WITH PROPOFOL;  Surgeon: Lucilla Lame, MD;  Location: [place];  Service: Endoscopy;  Laterality: N/A;   LEEP      Medical History: Past Medical History:  Diagnosis Date   Allergy    Heart murmur     Family History: Family History  Problem Relation Age of Onset   Colon cancer Mother    Breast cancer Mother 51   COPD Father       Review of Systems  Constitutional:  Negative for chills, fatigue and unexpected weight change.  HENT:  Positive for postnasal drip. Negative for congestion, rhinorrhea, sneezing and sore throat.   Eyes:  Negative for redness.  Respiratory:  Negative for cough, chest tightness and shortness of breath.   Cardiovascular:  Negative for chest pain and palpitations.  Gastrointestinal:  Negative for abdominal pain, constipation, diarrhea, nausea and vomiting.  Genitourinary:  Positive  for urgency. Negative for dysuria and frequency.  Musculoskeletal:  Positive for arthralgias and back pain. Negative for joint swelling and neck pain.  Skin:  Negative for rash.  Neurological: Negative.  Negative for tremors and numbness.  Hematological:  Negative for adenopathy. Does not bruise/bleed easily.  Psychiatric/Behavioral:  Negative for behavioral problems (Depression), sleep disturbance and suicidal ideas.  The patient is not nervous/anxious.      Vital Signs: BP 103/61   Pulse 67   Temp 98 F (36.7 C)   Resp 16   Ht _0  (1.651 m)   Wt 140 lb 3.2 oz (63.6 kg)   SpO2 97%   BMI 23.33 kg/m    Physical Exam Vitals and nursing note reviewed.  Constitutional:      General: She is not in acute distress.    Appearance: She is well-developed and normal weight. She is not diaphoretic.  HENT:     Head: Normocephalic and atraumatic.     Right Ear: External ear normal.     Left Ear: External ear normal.     Nose: Nose normal.     Mouth/Throat:     Pharynx: No oropharyngeal exudate.  Eyes:     General: No scleral icterus.       Right eye: No discharge.        Left eye: No discharge.     Conjunctiva/sclera: Conjunctivae normal.     Pupils: Pupils are equal, round, and reactive to light.  Neck:     Thyroid: No thyromegaly.     Vascular: No JVD.     Trachea: No tracheal deviation.  Cardiovascular:     Rate and Rhythm: Normal rate and regular rhythm.     Heart sounds: Normal heart sounds. No murmur heard.    No friction rub. No gallop.  Pulmonary:     Effort: Pulmonary effort is normal. No respiratory distress.     Breath sounds: Normal breath sounds. No stridor. No wheezing or rales.  Chest:     Chest wall: No tenderness.  Abdominal:     General: Bowel sounds are normal. There is no distension.     Palpations: Abdomen is soft. There is no mass.     Tenderness: There is no abdominal tenderness. There is no guarding or rebound.  Musculoskeletal:        General: No tenderness or deformity. Normal range of motion.     Cervical back: Normal range of motion and neck supple.     Right lower leg: No edema.     Left lower leg: No edema.  Lymphadenopathy:     Cervical: No cervical adenopathy.  Skin:    General: Skin is warm and dry.     Coloration: Skin is not pale.     Findings: No erythema or rash.  Neurological:     Mental Status: She is alert.     Cranial Nerves: No cranial  nerve deficit.     Motor: No abnormal muscle tone.     Coordination: Coordination normal.     Deep Tendon Reflexes: Reflexes are normal and symmetric.  Psychiatric:        Behavior: Behavior normal.        Thought Content: Thought content normal.        Judgment: Judgment normal.      LABS: Recent Results (from the past 2160 hour(s))  CBC w/Diff/Platelet     Status: Abnormal   Collection Time: 09/19/21 10:06 AM  Result Value Ref Range  WBC 7.2 3.4 - 10.8 x10E3/uL   RBC 4.30 3.77 - 5.28 x10E6/uL   Hemoglobin 14.4 11.1 - 15.9 g/dL   Hematocrit 40.8 34.0 - 46.6 %   MCV 95 79 - 97 fL   MCH 33.5 (H) 26.6 - 33.0 pg   MCHC 35.3 31.5 - 35.7 g/dL   RDW 11.2 (L) 11.7 - 15.4 %   Platelets 317 150 - 450 x10E3/uL   Neutrophils 73 Not Estab. %   Lymphs 18 Not Estab. %   Monocytes 9 Not Estab. %   Eos 0 Not Estab. %   Basos 0 Not Estab. %   Neutrophils Absolute 5.2 1.4 - 7.0 x10E3/uL   Lymphocytes Absolute 1.3 0.7 - 3.1 x10E3/uL   Monocytes Absolute 0.6 0.1 - 0.9 x10E3/uL   EOS (ABSOLUTE) 0.0 0.0 - 0.4 x10E3/uL   Basophils Absolute 0.0 0.0 - 0.2 x10E3/uL   Immature Granulocytes 0 Not Estab. %   Immature Grans (Abs) 0.0 0.0 - 0.1 x10E3/uL  Comprehensive metabolic panel     Status: Abnormal   Collection Time: 09/19/21 10:06 AM  Result Value Ref Range   Glucose 84 70 - 99 mg/dL   BUN 10 8 - 27 mg/dL   Creatinine, Ser 0.82 0.57 - 1.00 mg/dL   eGFR 79 >59 mL/min/1.73   BUN/Creatinine Ratio 12 12 - 28   Sodium 138 134 - 144 mmol/L   Potassium 4.0 3.5 - 5.2 mmol/L   Chloride 101 96 - 106 mmol/L   CO2 21 20 - 29 mmol/L   Calcium 9.3 8.7 - 10.3 mg/dL   Total Protein 6.9 6.0 - 8.5 g/dL   Albumin 4.8 3.9 - 4.9 g/dL   Globulin, Total 2.1 1.5 - 4.5 g/dL   Albumin/Globulin Ratio 2.3 (H) 1.2 - 2.2   Bilirubin Total 0.5 0.0 - 1.2 mg/dL   Alkaline Phosphatase 80 44 - 121 IU/L   AST 26 0 - 40 IU/L   ALT 23 0 - 32 IU/L  TSH + free T4     Status: None   Collection Time: 09/19/21 10:06 AM   Result Value Ref Range   TSH 1.080 0.450 - 4.500 uIU/mL   Free T4 1.15 0.82 - 1.77 ng/dL  Lipid Panel With LDL/HDL Ratio     Status: Abnormal   Collection Time: 09/19/21 10:06 AM  Result Value Ref Range   Cholesterol, Total 181 100 - 199 mg/dL   Triglycerides 89 0 - 149 mg/dL   HDL 55 >39 mg/dL   VLDL Cholesterol Cal 16 5 - 40 mg/dL   LDL Chol Calc (NIH) 110 (H) 0 - 99 mg/dL   LDL/HDL Ratio 2.0 0.0 - 3.2 ratio    Comment:                                     LDL/HDL Ratio                                             Men  Women                               1/2 Avg.Risk  1.0    1.5  Avg.Risk  3.6    3.2                                2X Avg.Risk  6.2    5.0                                3X Avg.Risk  8.0    6.1   B12 and Folate Panel     Status: None   Collection Time: 09/19/21 10:06 AM  Result Value Ref Range   Vitamin B-12 396 232 - 1,245 pg/mL   Folate 10.8 >3.0 ng/mL    Comment: A serum folate concentration of less than 3.1 ng/mL is considered to represent clinical deficiency.   VITAMIN D 25 Hydroxy (Vit-D Deficiency, Fractures)     Status: None   Collection Time: 09/19/21 10:06 AM  Result Value Ref Range   Vit D, 25-Hydroxy 31.1 30.0 - 100.0 ng/mL    Comment: Vitamin D deficiency has been defined by the [place] practice guideline as a level of serum 25-OH vitamin D less than 20 ng/mL (1,2). The Endocrine Society went on to further define vitamin D insufficiency as a level between 21 and 29 ng/mL (2). 1. IOM (Institute of Medicine). 2010. Dietary reference    intakes for calcium and D. [place]: The    [employer]. 2. Holick MF, Binkley Prichard, Bischoff-Ferrari HA, et al.    Evaluation, treatment, and prevention of vitamin D    deficiency: an Endocrine Society clinical practice    guideline. JCEM. 2011 Jul; 96(7):1911-30.   UA/M w/rflx Culture, Routine     Status: Abnormal    Collection Time: 09/19/21  2:10 PM   Specimen: Urine   Urine  Result Value Ref Range   Specific Gravity, UA 1.019 1.005 - 1.030   pH, UA 5.5 5.0 - 7.5   Color, UA Yellow Yellow   Appearance Ur Clear Clear   Leukocytes,UA 1+ (A) Negative   Protein,UA Trace Negative/Trace   Glucose, UA Negative Negative   Ketones, UA Trace (A) Negative   RBC, UA Trace (A) Negative   Bilirubin, UA Negative Negative   Urobilinogen, Ur 0.2 0.2 - 1.0 mg/dL   Nitrite, UA Negative Negative   Microscopic Examination See below:     Comment: Microscopic was indicated and was performed.   Urinalysis Reflex Comment     Comment: This specimen has reflexed to a Urine Culture.  Microscopic Examination     Status: Abnormal   Collection Time: 09/19/21  2:10 PM   Urine  Result Value Ref Range   WBC, UA 6-10 (A) 0 - 5 /hpf   RBC, Urine 3-10 (A) 0 - 2 /hpf   Epithelial Cells (non renal) 0-10 0 - 10 /hpf   Casts None seen None seen /lpf   Bacteria, UA None seen None seen/Few  Urine Culture, Reflex     Status: None   Collection Time: 09/19/21  2:10 PM   Urine  Result Value Ref Range   Urine Culture, Routine Final report    Organism ID, Bacteria Comment     Comment: Mixed urogenital flora 10,000-25,000 colony forming units per mL         Assessment/Plan: 1. Encounter for general adult medical examination with abnormal findings CPE performed, routine fasting labs ordered, will update bone density and  mammogram  2. Senile osteoporosis We will stop Fosamax and order repeat bone density - DG Bone Density; Future  3. Visit for screening mammogram - MM 3D SCREEN BREAST BILATERAL; Future  4. Mixed hyperlipidemia - Lipid Panel With LDL/HDL Ratio  5. Abnormal thyroid exam - TSH + free T4  6. B12 deficiency - B12 and Folate Panel  7. Vitamin D deficiency - VITAMIN D 25 Hydroxy (Vit-D Deficiency, Fractures)  8. Other fatigue - CBC w/Diff/Platelet - Comprehensive metabolic panel - TSH + free T4 -  Lipid Panel With LDL/HDL Ratio - B12 and Folate Panel  9. Dysuria - UA/M w/rflx Culture, Routine,   General Counseling: Briah verbalizes understanding of the findings of todays visit and agrees with plan of treatment. I have discussed any further diagnostic evaluation that may be needed or ordered today. We also reviewed her medications today. she has been encouraged to call the office with any questions or concerns that should arise related to todays visit.    Counseling:    Orders Placed This Encounter  Procedures   Microscopic Examination   Urine Culture, Reflex   MM 3D SCREEN BREAST BILATERAL   DG Bone Density   UA/M w/rflx Culture, Routine   CBC w/Diff/Platelet   Comprehensive metabolic panel   TSH + free T4   Lipid Panel With LDL/HDL Ratio   B12 and Folate Panel   VITAMIN D 25 Hydroxy (Vit-D Deficiency, Fractures)    Meds ordered this encounter  Medications   Zoster Vaccine Adjuvanted [facility]) injection    Sig: Inject 0.5 mLs into the muscle once for 1 dose.    Dispense:  0.5 mL    Refill:  0   Tdap (BOOSTRIX) 5-2.5-18.5 LF-MCG/0.5 injection    Sig: Inject 0.5 mLs into the muscle once for 1 dose.    Dispense:  0.5 mL    Refill:  0   pneumococcal 20-valent conjugate vaccine (PREVNAR 20) 0.5 ML injection    Sig: Inject 0.5 mLs into the muscle tomorrow at 10 am for 1 dose.    Dispense:  0.5 mL    Refill:  0    This patient was seen by Drema Dallas, PA-C in collaboration with Dr. Clayborn Bigness as a part of collaborative care agreement.  Total time spent:35 Minutes  Time spent includes review of chart, medications, test results, and follow up plan with the patient.     Lavera Guise, MD  Internal Medicine

## 2021-09-20 LAB — CBC WITH DIFFERENTIAL/PLATELET
Basophils Absolute: 0 10*3/uL (ref 0.0–0.2)
Basos: 0 %
EOS (ABSOLUTE): 0 10*3/uL (ref 0.0–0.4)
Eos: 0 %
Hematocrit: 40.8 % (ref 34.0–46.6)
Hemoglobin: 14.4 g/dL (ref 11.1–15.9)
Immature Grans (Abs): 0 10*3/uL (ref 0.0–0.1)
Immature Granulocytes: 0 %
Lymphocytes Absolute: 1.3 10*3/uL (ref 0.7–3.1)
Lymphs: 18 %
MCH: 33.5 pg — ABNORMAL HIGH (ref 26.6–33.0)
MCHC: 35.3 g/dL (ref 31.5–35.7)
MCV: 95 fL (ref 79–97)
Monocytes Absolute: 0.6 10*3/uL (ref 0.1–0.9)
Monocytes: 9 %
Neutrophils Absolute: 5.2 10*3/uL (ref 1.4–7.0)
Neutrophils: 73 %
Platelets: 317 10*3/uL (ref 150–450)
RBC: 4.3 x10E6/uL (ref 3.77–5.28)
RDW: 11.2 % — ABNORMAL LOW (ref 11.7–15.4)
WBC: 7.2 10*3/uL (ref 3.4–10.8)

## 2021-09-20 LAB — LIPID PANEL WITH LDL/HDL RATIO
Cholesterol, Total: 181 mg/dL (ref 100–199)
HDL: 55 mg/dL (ref >39)
LDL Chol Calc (NIH): 110 mg/dL — ABNORMAL HIGH (ref 0–99)
LDL/HDL Ratio: 2 ratio (ref 0.0–3.2)
Triglycerides: 89 mg/dL (ref 0–149)
VLDL Cholesterol Cal: 16 mg/dL (ref 5–40)

## 2021-09-20 LAB — COMPREHENSIVE METABOLIC PANEL
ALT: 23 IU/L (ref 0–32)
AST: 26 IU/L (ref 0–40)
Albumin/Globulin Ratio: 2.3 — ABNORMAL HIGH (ref 1.2–2.2)
Albumin: 4.8 g/dL (ref 3.9–4.9)
Alkaline Phosphatase: 80 IU/L (ref 44–121)
BUN/Creatinine Ratio: 12 (ref 12–28)
BUN: 10 mg/dL (ref 8–27)
Bilirubin Total: 0.5 mg/dL (ref 0.0–1.2)
CO2: 21 mmol/L (ref 20–29)
Calcium: 9.3 mg/dL (ref 8.7–10.3)
Chloride: 101 mmol/L (ref 96–106)
Creatinine, Ser: 0.82 mg/dL (ref 0.57–1.00)
Globulin, Total: 2.1 g/dL (ref 1.5–4.5)
Glucose: 84 mg/dL (ref 70–99)
Potassium: 4 mmol/L (ref 3.5–5.2)
Sodium: 138 mmol/L (ref 134–144)
Total Protein: 6.9 g/dL (ref 6.0–8.5)
eGFR: 79 mL/min/{1.73_m2} (ref >59)

## 2021-09-20 LAB — VITAMIN D 25 HYDROXY (VIT D DEFICIENCY, FRACTURES): Vit D, 25-Hydroxy: 31.1 ng/mL (ref 30.0–100.0)

## 2021-09-20 LAB — B12 AND FOLATE PANEL
Folate: 10.8 ng/mL (ref >3.0)
Vitamin B-12: 396 pg/mL (ref 232–1245)

## 2021-09-20 LAB — TSH+FREE T4
Free T4: 1.15 ng/dL (ref 0.82–1.77)
TSH: 1.08 u[IU]/mL (ref 0.450–4.500)

## 2021-09-22 LAB — MICROSCOPIC EXAMINATION
Bacteria, UA: NONE SEEN
Casts: NONE SEEN /lpf

## 2021-09-22 LAB — UA/M W/RFLX CULTURE, ROUTINE
Bilirubin, UA: NEGATIVE
Glucose, UA: NEGATIVE
Nitrite, UA: NEGATIVE
Specific Gravity, UA: 1.019 (ref 1.005–1.030)
Urobilinogen, Ur: 0.2 mg/dL (ref 0.2–1.0)
pH, UA: 5.5 (ref 5.0–7.5)

## 2021-09-22 LAB — URINE CULTURE, REFLEX

## 2021-09-23 DIAGNOSIS — M25551 Pain in right hip: Secondary | ICD-10-CM | POA: Diagnosis not present

## 2021-09-23 DIAGNOSIS — M5416 Radiculopathy, lumbar region: Secondary | ICD-10-CM | POA: Diagnosis not present

## 2021-09-23 DIAGNOSIS — M6281 Muscle weakness (generalized): Secondary | ICD-10-CM | POA: Diagnosis not present

## 2021-09-24 ENCOUNTER — Telehealth: Payer: Self-pay

## 2021-09-24 NOTE — Telephone Encounter (Signed)
Pt notified for labs result and mailed  prudent diet to pt

## 2021-09-24 NOTE — Telephone Encounter (Signed)
-----   Message from Carlean Jews, PA-C sent at 09/24/2021  1:13 PM EDT ----- Please let her know that her cholesterol is a little elevated and to continue to work on diet and exercise. Her b12 is on the low side and could consider supplementing, especially if any numbness or tingling. Vit D also borderline low

## 2021-09-25 DIAGNOSIS — M6281 Muscle weakness (generalized): Secondary | ICD-10-CM | POA: Diagnosis not present

## 2021-09-25 DIAGNOSIS — M5416 Radiculopathy, lumbar region: Secondary | ICD-10-CM | POA: Diagnosis not present

## 2021-09-25 DIAGNOSIS — M25551 Pain in right hip: Secondary | ICD-10-CM | POA: Diagnosis not present

## 2021-09-30 DIAGNOSIS — M5416 Radiculopathy, lumbar region: Secondary | ICD-10-CM | POA: Diagnosis not present

## 2021-09-30 DIAGNOSIS — M25551 Pain in right hip: Secondary | ICD-10-CM | POA: Diagnosis not present

## 2021-09-30 DIAGNOSIS — M6281 Muscle weakness (generalized): Secondary | ICD-10-CM | POA: Diagnosis not present

## 2021-10-02 DIAGNOSIS — M6281 Muscle weakness (generalized): Secondary | ICD-10-CM | POA: Diagnosis not present

## 2021-10-02 DIAGNOSIS — M25551 Pain in right hip: Secondary | ICD-10-CM | POA: Diagnosis not present

## 2021-10-02 DIAGNOSIS — M5416 Radiculopathy, lumbar region: Secondary | ICD-10-CM | POA: Diagnosis not present

## 2021-10-03 DIAGNOSIS — M5136 Other intervertebral disc degeneration, lumbar region: Secondary | ICD-10-CM | POA: Diagnosis not present

## 2021-10-03 DIAGNOSIS — M5126 Other intervertebral disc displacement, lumbar region: Secondary | ICD-10-CM | POA: Diagnosis not present

## 2021-10-03 DIAGNOSIS — M5416 Radiculopathy, lumbar region: Secondary | ICD-10-CM | POA: Diagnosis not present

## 2021-10-03 DIAGNOSIS — M48061 Spinal stenosis, lumbar region without neurogenic claudication: Secondary | ICD-10-CM | POA: Diagnosis not present

## 2021-10-09 DIAGNOSIS — M5416 Radiculopathy, lumbar region: Secondary | ICD-10-CM | POA: Diagnosis not present

## 2021-10-10 DIAGNOSIS — M25551 Pain in right hip: Secondary | ICD-10-CM | POA: Diagnosis not present

## 2021-10-10 DIAGNOSIS — M6281 Muscle weakness (generalized): Secondary | ICD-10-CM | POA: Diagnosis not present

## 2021-10-10 DIAGNOSIS — M5416 Radiculopathy, lumbar region: Secondary | ICD-10-CM | POA: Diagnosis not present

## 2021-10-24 DIAGNOSIS — M25551 Pain in right hip: Secondary | ICD-10-CM | POA: Diagnosis not present

## 2021-10-24 DIAGNOSIS — M5416 Radiculopathy, lumbar region: Secondary | ICD-10-CM | POA: Diagnosis not present

## 2021-10-24 DIAGNOSIS — M6281 Muscle weakness (generalized): Secondary | ICD-10-CM | POA: Diagnosis not present

## 2021-10-30 DIAGNOSIS — M5416 Radiculopathy, lumbar region: Secondary | ICD-10-CM | POA: Diagnosis not present

## 2021-10-30 DIAGNOSIS — M25551 Pain in right hip: Secondary | ICD-10-CM | POA: Diagnosis not present

## 2021-10-30 DIAGNOSIS — M6281 Muscle weakness (generalized): Secondary | ICD-10-CM | POA: Diagnosis not present

## 2021-11-06 DIAGNOSIS — M5416 Radiculopathy, lumbar region: Secondary | ICD-10-CM | POA: Diagnosis not present

## 2021-11-06 DIAGNOSIS — M6281 Muscle weakness (generalized): Secondary | ICD-10-CM | POA: Diagnosis not present

## 2021-11-06 DIAGNOSIS — M25551 Pain in right hip: Secondary | ICD-10-CM | POA: Diagnosis not present

## 2021-11-12 ENCOUNTER — Ambulatory Visit
Admission: RE | Admit: 2021-11-12 | Discharge: 2021-11-12 | Disposition: A | Discharge status: Home / Self Care | Payer: Medicare Other | Source: Ambulatory Visit | Attending: Physician Assistant | Admitting: Physician Assistant

## 2021-11-12 DIAGNOSIS — Z1231 Encounter for screening mammogram for malignant neoplasm of breast: Secondary | ICD-10-CM | POA: Diagnosis not present

## 2021-11-12 DIAGNOSIS — M8589 Other specified disorders of bone density and structure, multiple sites: Secondary | ICD-10-CM | POA: Diagnosis not present

## 2021-11-12 DIAGNOSIS — M810 Age-related osteoporosis without current pathological fracture: Secondary | ICD-10-CM | POA: Diagnosis not present

## 2021-11-12 DIAGNOSIS — Z780 Asymptomatic menopausal state: Secondary | ICD-10-CM | POA: Diagnosis not present

## 2021-11-13 DIAGNOSIS — M25551 Pain in right hip: Secondary | ICD-10-CM | POA: Diagnosis not present

## 2021-11-13 DIAGNOSIS — M6281 Muscle weakness (generalized): Secondary | ICD-10-CM | POA: Diagnosis not present

## 2021-11-13 DIAGNOSIS — M5416 Radiculopathy, lumbar region: Secondary | ICD-10-CM | POA: Diagnosis not present

## 2021-11-14 ENCOUNTER — Encounter: Payer: Self-pay | Admitting: Physician Assistant

## 2021-11-14 NOTE — Telephone Encounter (Signed)
See app

## 2021-11-15 ENCOUNTER — Other Ambulatory Visit: Payer: Self-pay | Admitting: Physician Assistant

## 2021-11-15 ENCOUNTER — Encounter: Payer: Self-pay | Admitting: Physician Assistant

## 2021-11-15 DIAGNOSIS — M810 Age-related osteoporosis without current pathological fracture: Secondary | ICD-10-CM

## 2021-11-15 MED ORDER — IBANDRONATE SODIUM 150 MG PO TABS: ORAL_TABLET | ORAL | 3 refills | Status: DC

## 2021-11-25 ENCOUNTER — Other Ambulatory Visit: Payer: Self-pay

## 2021-11-25 ENCOUNTER — Telehealth: Payer: Self-pay

## 2021-11-25 DIAGNOSIS — M810 Age-related osteoporosis without current pathological fracture: Secondary | ICD-10-CM

## 2021-11-25 MED ORDER — IBANDRONATE SODIUM 150 MG PO TABS: ORAL_TABLET | ORAL | 3 refills | Status: DC

## 2021-11-25 NOTE — Telephone Encounter (Signed)
Pt advised that we resend boniva to [facility]

## 2021-11-26 ENCOUNTER — Other Ambulatory Visit: Payer: Self-pay

## 2021-11-26 DIAGNOSIS — M810 Age-related osteoporosis without current pathological fracture: Secondary | ICD-10-CM

## 2021-11-26 MED ORDER — IBANDRONATE SODIUM 150 MG PO TABS: ORAL_TABLET | ORAL | 3 refills | Status: DC

## 2021-11-27 DIAGNOSIS — H43811 Vitreous degeneration, right eye: Secondary | ICD-10-CM | POA: Diagnosis not present

## 2021-11-29 ENCOUNTER — Telehealth: Payer: Self-pay

## 2021-11-29 NOTE — Telephone Encounter (Signed)
Tried to call patient to see if she picked up the medicine Ibandronate sodium 150mg  tablets.

## 2021-12-09 DIAGNOSIS — D225 Melanocytic nevi of trunk: Secondary | ICD-10-CM | POA: Diagnosis not present

## 2021-12-09 DIAGNOSIS — D2261 Melanocytic nevi of right upper limb, including shoulder: Secondary | ICD-10-CM | POA: Diagnosis not present

## 2021-12-09 DIAGNOSIS — D2262 Melanocytic nevi of left upper limb, including shoulder: Secondary | ICD-10-CM | POA: Diagnosis not present

## 2021-12-09 DIAGNOSIS — Z85828 Personal history of other malignant neoplasm of skin: Secondary | ICD-10-CM | POA: Diagnosis not present

## 2021-12-19 DIAGNOSIS — M5416 Radiculopathy, lumbar region: Secondary | ICD-10-CM | POA: Diagnosis not present

## 2021-12-19 DIAGNOSIS — M48061 Spinal stenosis, lumbar region without neurogenic claudication: Secondary | ICD-10-CM | POA: Diagnosis not present

## 2021-12-19 DIAGNOSIS — M5136 Other intervertebral disc degeneration, lumbar region: Secondary | ICD-10-CM | POA: Diagnosis not present

## 2021-12-19 DIAGNOSIS — M5126 Other intervertebral disc displacement, lumbar region: Secondary | ICD-10-CM | POA: Diagnosis not present

## 2022-01-30 ENCOUNTER — Ambulatory Visit (INDEPENDENT_AMBULATORY_CARE_PROVIDER_SITE_OTHER): Payer: Medicare Other | Admitting: Physician Assistant

## 2022-01-30 ENCOUNTER — Encounter: Payer: Self-pay | Admitting: Physician Assistant

## 2022-01-30 VITALS — BP 114/63 | HR 70 | Temp 98.0°F | Resp 16 | Ht 65.0 in | Wt 142.8 lb

## 2022-01-30 DIAGNOSIS — M8589 Other specified disorders of bone density and structure, multiple sites: Secondary | ICD-10-CM | POA: Diagnosis not present

## 2022-01-30 NOTE — Progress Notes (Signed)
[facility] [place], [place] [address]  Internal MEDICINE  Office Visit Note  Patient Name: Mary West  [id number]  [id number]  Date of Service: 02/05/2022  Chief Complaint  Patient presents with   Follow-up    HPI Pt is here for routine follow up -Doing well on Boniva monthly after BMD showed progression of osteopenia despite prior treatment with fosamax for several years -No side effects from the medication -Does continue to have hip pain and is seeing ortho and doing PT -Did get her flu shot, will get shingles and tdap and consider PNA vaccine  Current Medication: Outpatient Encounter Medications as of 01/30/2022  Medication Sig   calcium carbonate (OS-CAL) 600 MG TABS tablet Take 600 mg by mouth daily.   ibandronate (BONIVA) 150 MG tablet Take in the morning with a full glass of water, on an empty stomach once a month,and do not take anything else by mouth or lie down for the next 30 min.   meloxicam (MOBIC) 7.5 MG tablet Take 7.5 mg by mouth daily.   Multiple Vitamins-Minerals (MULTIVITAMIN ADULTS PO) Take by mouth daily.   nitrofurantoin, macrocrystal-monohydrate, (MACROBID) 100 MG capsule Take 1 capsule (100 mg total) by mouth 2 (two) times daily.   No facility-administered encounter medications on file as of 01/30/2022.    Surgical History: Past Surgical History:  Procedure Laterality Date   COLONOSCOPY     COLONOSCOPY WITH PROPOFOL N/A 12/17/2020   Procedure: COLONOSCOPY WITH PROPOFOL;  Surgeon: Lucilla Lame, MD;  Location: [facility];  Service: Endoscopy;  Laterality: N/A;   LEEP      Medical History: Past Medical History:  Diagnosis Date   Allergy    Heart murmur     Family History: Family History  Problem Relation Age of Onset   Colon cancer Mother    Breast cancer Mother 58   COPD Father     Social History   Socioeconomic History   Marital status: Single    Spouse name: Not on file   Number of  children: Not on file   Years of education: Not on file   Highest education level: Not on file  Occupational History   Not on file  Tobacco Use   Smoking status: Never   Smokeless tobacco: Never  Vaping Use   Vaping Use: Never used  Substance and Sexual Activity   Alcohol use: Yes    Comment: occasionally    Drug use: Never   Sexual activity: Not on file  Other Topics Concern   Not on file  Social History Narrative   Not on file   Social Determinants of Health   Financial Resource Strain: Not on file  Food Insecurity: Not on file  Transportation Needs: Not on file  Physical Activity: Not on file  Stress: Not on file  Social Connections: Not on file  Intimate Partner Violence: Not on file      Review of Systems  Constitutional:  Negative for chills, fatigue and unexpected weight change.  HENT:  Negative for congestion, postnasal drip, rhinorrhea, sneezing and sore throat.   Eyes:  Negative for redness.  Respiratory:  Negative for cough, chest tightness and shortness of breath.   Cardiovascular:  Negative for chest pain and palpitations.  Gastrointestinal:  Negative for abdominal pain, constipation, diarrhea, nausea and vomiting.  Genitourinary:  Negative for dysuria and frequency.  Musculoskeletal:  Positive for arthralgias and back pain. Negative for joint swelling and neck pain.  Skin:  Negative for rash.  Neurological: Negative.  Negative for tremors and numbness.  Hematological:  Negative for adenopathy. Does not bruise/bleed easily.  Psychiatric/Behavioral:  Negative for behavioral problems (Depression), sleep disturbance and suicidal ideas. The patient is not nervous/anxious.     Vital Signs: BP 114/63   Pulse 70   Temp 98 F (36.7 C)   Resp 16   Ht 5\' 5"  (1.651 m)   Wt 142 lb 12.8 oz (64.8 kg)   SpO2 99%   BMI 23.76 kg/m    Physical Exam Vitals and nursing note reviewed.  Constitutional:      General: She is not in acute distress.    Appearance:  Normal appearance. She is well-developed and normal weight. She is not diaphoretic.  HENT:     Head: Normocephalic and atraumatic.     Mouth/Throat:     Pharynx: No oropharyngeal exudate.  Eyes:     Pupils: Pupils are equal, round, and reactive to light.  Neck:     Thyroid: No thyromegaly.     Vascular: No JVD.     Trachea: No tracheal deviation.  Cardiovascular:     Rate and Rhythm: Normal rate and regular rhythm.     Heart sounds: Normal heart sounds. No murmur heard.    No friction rub. No gallop.  Pulmonary:     Effort: Pulmonary effort is normal. No respiratory distress.     Breath sounds: No wheezing or rales.  Chest:     Chest wall: No tenderness.  Abdominal:     General: Bowel sounds are normal.     Palpations: Abdomen is soft.  Musculoskeletal:        General: Normal range of motion.     Cervical back: Normal range of motion and neck supple.  Lymphadenopathy:     Cervical: No cervical adenopathy.  Skin:    General: Skin is warm and dry.  Neurological:     Mental Status: She is alert and oriented to person, place, and time.     Cranial Nerves: No cranial nerve deficit.  Psychiatric:        Behavior: Behavior normal.        Thought Content: Thought content normal.        Judgment: Judgment normal.        Assessment/Plan: 1. Osteopenia of multiple sites Doing well on Boniva after BMD showed significant progression of osteopenia. Will continue this monthly.   General Counseling: Mary West understanding of the findings of todays visit and agrees with plan of treatment. I have discussed any further diagnostic evaluation that may be needed or ordered today. We also reviewed her medications today. she has been encouraged to call the office with any questions or concerns that should arise related to todays visit.    No orders of the defined types were placed in this encounter.   No orders of the defined types were placed in this encounter.   This  patient was seen by Vevelyn Francois, PA-C in collaboration with Dr. Lynn Ito as a part of collaborative care agreement.   Total time spent:30 Minutes Time spent includes review of chart, medications, test results, and follow up plan with the patient.      Dr Beverely Risen Internal medicine

## 2022-02-20 DIAGNOSIS — M48061 Spinal stenosis, lumbar region without neurogenic claudication: Secondary | ICD-10-CM | POA: Diagnosis not present

## 2022-02-20 DIAGNOSIS — M5416 Radiculopathy, lumbar region: Secondary | ICD-10-CM | POA: Diagnosis not present

## 2022-02-20 DIAGNOSIS — M5136 Other intervertebral disc degeneration, lumbar region: Secondary | ICD-10-CM | POA: Diagnosis not present

## 2022-02-20 DIAGNOSIS — M5126 Other intervertebral disc displacement, lumbar region: Secondary | ICD-10-CM | POA: Diagnosis not present

## 2022-03-19 DIAGNOSIS — M5416 Radiculopathy, lumbar region: Secondary | ICD-10-CM | POA: Diagnosis not present

## 2022-03-29 DIAGNOSIS — R300 Dysuria: Secondary | ICD-10-CM | POA: Diagnosis not present

## 2022-03-29 DIAGNOSIS — N390 Urinary tract infection, site not specified: Secondary | ICD-10-CM | POA: Diagnosis not present

## 2022-04-17 DIAGNOSIS — M5416 Radiculopathy, lumbar region: Secondary | ICD-10-CM | POA: Diagnosis not present

## 2022-04-17 DIAGNOSIS — M5126 Other intervertebral disc displacement, lumbar region: Secondary | ICD-10-CM | POA: Diagnosis not present

## 2022-04-17 DIAGNOSIS — M48061 Spinal stenosis, lumbar region without neurogenic claudication: Secondary | ICD-10-CM | POA: Diagnosis not present

## 2022-04-17 DIAGNOSIS — M5136 Other intervertebral disc degeneration, lumbar region: Secondary | ICD-10-CM | POA: Diagnosis not present

## 2022-05-29 DIAGNOSIS — M1611 Unilateral primary osteoarthritis, right hip: Secondary | ICD-10-CM | POA: Diagnosis not present

## 2022-05-29 DIAGNOSIS — M5440 Lumbago with sciatica, unspecified side: Secondary | ICD-10-CM | POA: Diagnosis not present

## 2022-05-29 DIAGNOSIS — M48061 Spinal stenosis, lumbar region without neurogenic claudication: Secondary | ICD-10-CM | POA: Diagnosis not present

## 2022-06-11 DIAGNOSIS — R7309 Other abnormal glucose: Secondary | ICD-10-CM | POA: Diagnosis not present

## 2022-06-11 DIAGNOSIS — M1611 Unilateral primary osteoarthritis, right hip: Secondary | ICD-10-CM | POA: Diagnosis not present

## 2022-06-11 DIAGNOSIS — M25551 Pain in right hip: Secondary | ICD-10-CM | POA: Diagnosis not present

## 2022-06-12 ENCOUNTER — Other Ambulatory Visit: Payer: Self-pay | Admitting: Orthopedic Surgery

## 2022-06-12 DIAGNOSIS — M25551 Pain in right hip: Secondary | ICD-10-CM

## 2022-06-13 ENCOUNTER — Encounter: Payer: Self-pay | Admitting: Orthopedic Surgery

## 2022-06-18 ENCOUNTER — Encounter: Payer: Self-pay | Admitting: Physician Assistant

## 2022-06-18 ENCOUNTER — Other Ambulatory Visit: Payer: Medicare Other

## 2022-06-30 ENCOUNTER — Ambulatory Visit
Admission: RE | Admit: 2022-06-30 | Discharge: 2022-06-30 | Disposition: A | Discharge status: Home / Self Care | Payer: Medicare Other | Source: Ambulatory Visit | Attending: Orthopedic Surgery | Admitting: Orthopedic Surgery

## 2022-06-30 DIAGNOSIS — M25551 Pain in right hip: Secondary | ICD-10-CM | POA: Diagnosis not present

## 2022-07-08 DIAGNOSIS — M1611 Unilateral primary osteoarthritis, right hip: Secondary | ICD-10-CM | POA: Diagnosis not present

## 2022-07-09 ENCOUNTER — Other Ambulatory Visit: Payer: Self-pay | Admitting: Orthopedic Surgery

## 2022-07-12 IMAGING — MG DIGITAL SCREENING BILAT W/ TOMO W/ CAD
6 of 10 series · 6 of 30 positions shown · non-contrast
Comparison: Previous exam(s).

CLINICAL DATA: Screening.

EXAM:
DIGITAL SCREENING BILATERAL MAMMOGRAM WITH TOMO AND CAD

[L MLO synth-2D]
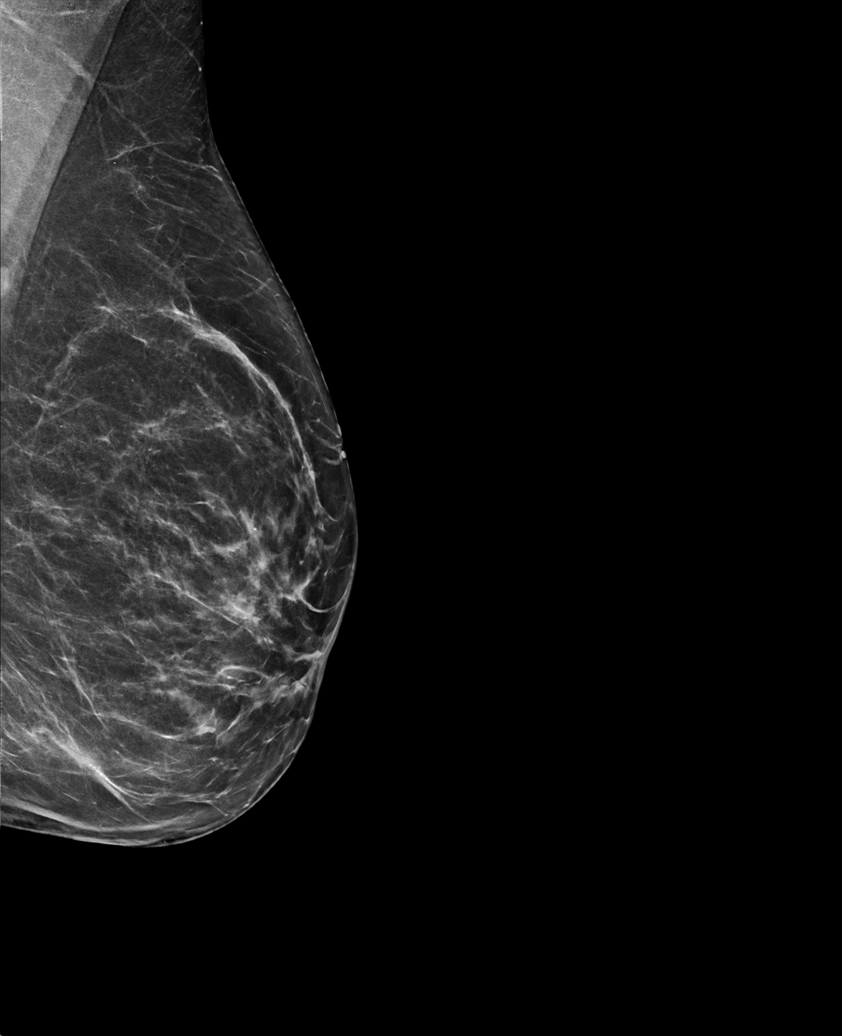

[L CC synth-2D]
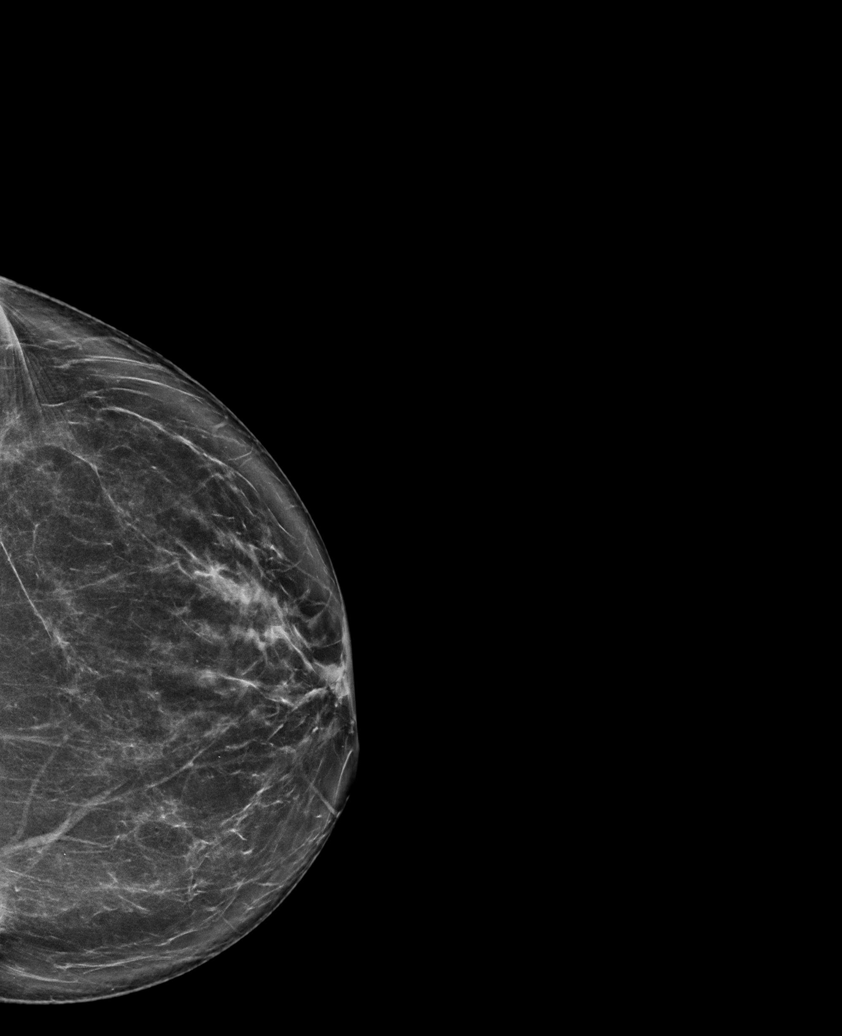

[R MLO synth-2D (1 of 2)]
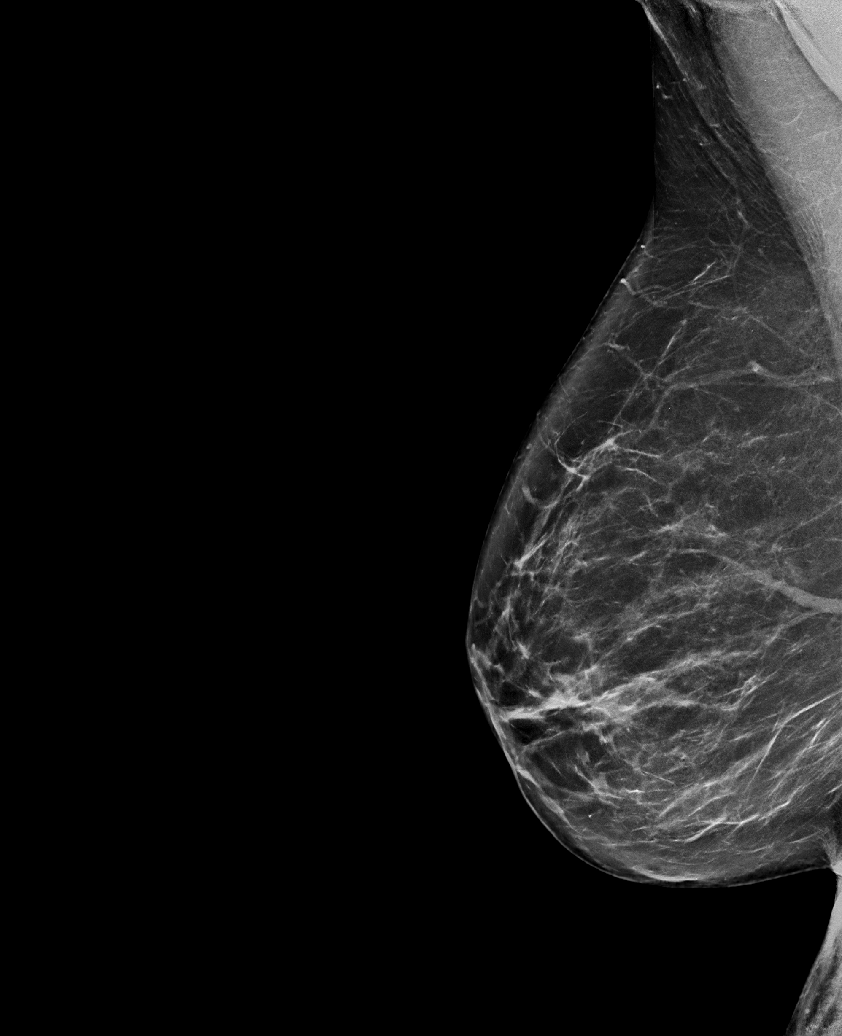

[R CC synth-2D]
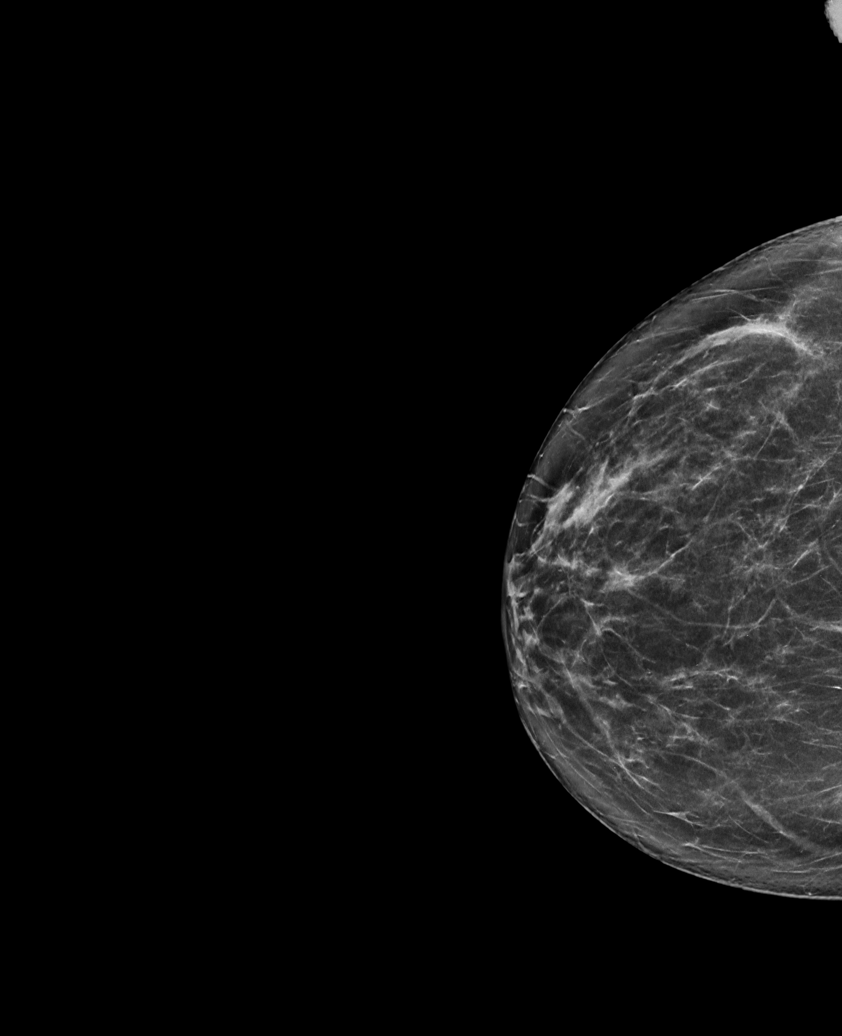

[R MLO synth-2D (2 of 2)]
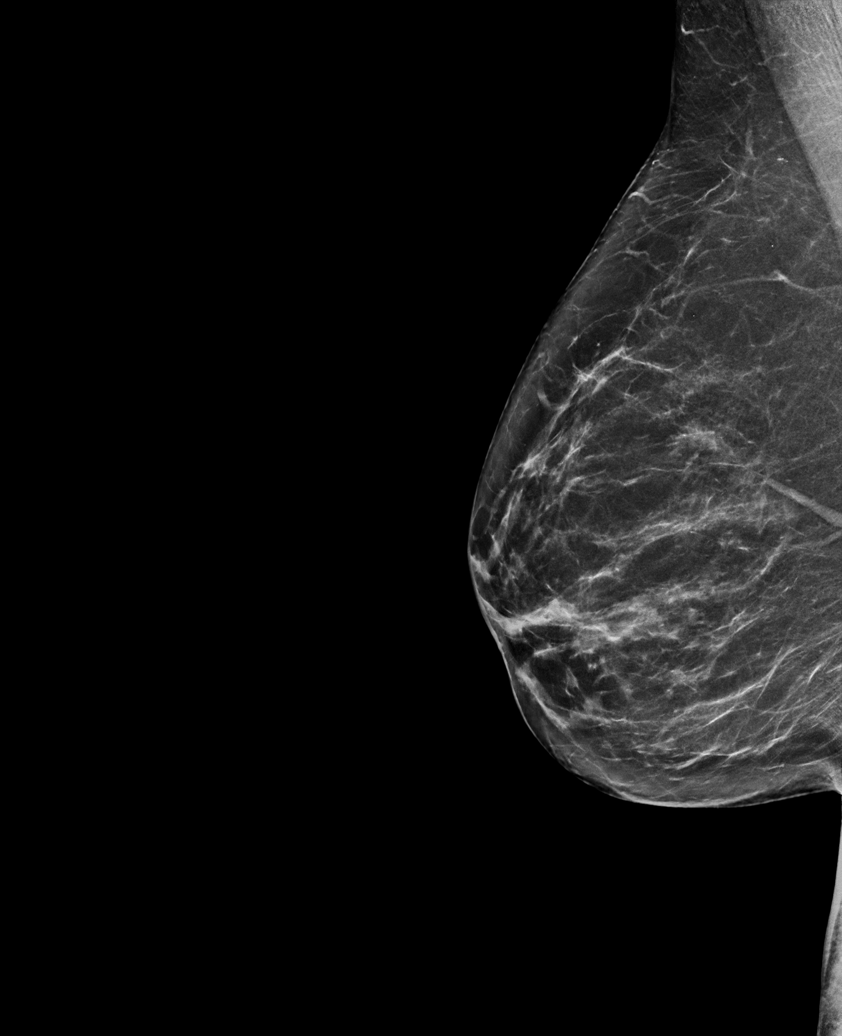

[R MLO tomo · tomo slice 43/86.0]
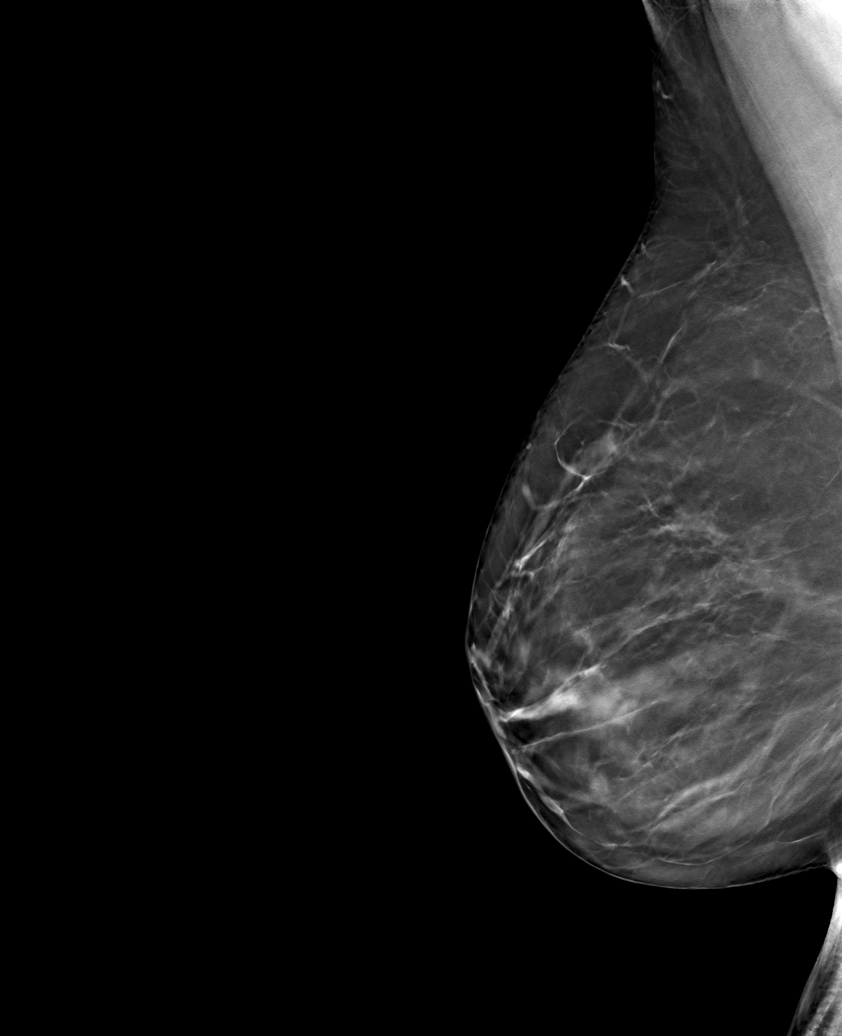

[6 of 30 positions shown; findings below may reference images not displayed]

ACR Breast Density Category b: There are scattered areas of
fibroglandular density.
FINDINGS: There are no findings suspicious for malignancy. Images were
processed with CAD.
IMPRESSION: No mammographic evidence of malignancy. A result letter of this
screening mammogram will be mailed directly to the patient.

RECOMMENDATION:
Screening mammogram in one year. (Code:CN-U-775)

BI-RADS CATEGORY  1: Negative.

## 2022-07-14 ENCOUNTER — Encounter: Payer: Self-pay | Admitting: Physician Assistant

## 2022-07-14 ENCOUNTER — Ambulatory Visit (INDEPENDENT_AMBULATORY_CARE_PROVIDER_SITE_OTHER): Payer: Medicare Other | Admitting: Physician Assistant

## 2022-07-14 VITALS — BP 110/85 | HR 77 | Temp 97.6°F | Resp 16 | Ht 64.0 in | Wt 144.6 lb

## 2022-07-14 DIAGNOSIS — Z01818 Encounter for other preprocedural examination: Secondary | ICD-10-CM

## 2022-07-14 DIAGNOSIS — M1611 Unilateral primary osteoarthritis, right hip: Secondary | ICD-10-CM

## 2022-07-14 NOTE — Progress Notes (Signed)
[facility] [address] [place], [place] [address]  Internal MEDICINE  Office Visit Note  Patient Name: Mary West  [id number]  [id number]  Date of Service: 07/14/2022  Chief Complaint  Patient presents with   Follow-up    Medical clearance     HPI Pt is here for routine follow up for medical clearance -Surgery is June 3rd for right THA. Her hip pain is significant and makes walking very difficult -Going to have preop on Wed and will have EKG done at that time and will also have labs done -Got shingles shots done since last visit -Does still take meloxicam and is going to stop on Wed at preop to give at least 5 days prior to surgery without it. Does not take any blood thinners. -Does have hearing aids now and states these have made a big impact on life and is pleased with them  Current Medication: Outpatient Encounter Medications as of 07/14/2022  Medication Sig   calcium carbonate (OS-CAL) 600 MG TABS tablet Take 600 mg by mouth daily.   ibandronate (BONIVA) 150 MG tablet Take in the morning with a full glass of water, on an empty stomach once a month,and do not take anything else by mouth or lie down for the next 30 min.   meloxicam (MOBIC) 7.5 MG tablet Take 7.5 mg by mouth daily.   Multiple Vitamins-Minerals (MULTIVITAMIN ADULTS PO) Take by mouth daily.   nitrofurantoin, macrocrystal-monohydrate, (MACROBID) 100 MG capsule Take 1 capsule (100 mg total) by mouth 2 (two) times daily.   No facility-administered encounter medications on file as of 07/14/2022.    Surgical History: Past Surgical History:  Procedure Laterality Date   COLONOSCOPY     COLONOSCOPY WITH PROPOFOL N/A 12/17/2020   Procedure: COLONOSCOPY WITH PROPOFOL;  Surgeon: Mary Minium, MD;  Location: [facility];  Service: Endoscopy;  Laterality: N/A;   LEEP      Medical History: Past Medical History:  Diagnosis Date   Allergy    Heart murmur     Family History: Family  History  Problem Relation Age of Onset   Colon cancer Mother    Breast cancer Mother 87   COPD Father     Social History   Socioeconomic History   Marital status: Single    Spouse name: Not on file   Number of children: Not on file   Years of education: Not on file   Highest education level: Not on file  Occupational History   Not on file  Tobacco Use   Smoking status: Never   Smokeless tobacco: Never  Vaping Use   Vaping Use: Never used  Substance and Sexual Activity   Alcohol use: Yes    Comment: occasionally    Drug use: Never   Sexual activity: Not on file  Other Topics Concern   Not on file  Social History Narrative   Not on file   Social Determinants of Health   Financial Resource Strain: Not on file  Food Insecurity: Not on file  Transportation Needs: Not on file  Physical Activity: Not on file  Stress: Not on file  Social Connections: Not on file  Intimate Partner Violence: Not on file      Review of Systems  Constitutional:  Negative for chills, fatigue and unexpected weight change.  HENT:  Negative for congestion, postnasal drip, rhinorrhea, sneezing and sore throat.   Eyes:  Negative for redness.  Respiratory:  Negative for cough, chest tightness and shortness  of breath.   Cardiovascular:  Negative for chest pain and palpitations.  Gastrointestinal:  Negative for abdominal pain, constipation, diarrhea, nausea and vomiting.  Genitourinary:  Negative for dysuria and frequency.  Musculoskeletal:  Positive for arthralgias and back pain. Negative for joint swelling and neck pain.  Skin:  Negative for rash.  Neurological: Negative.  Negative for tremors and numbness.  Hematological:  Negative for adenopathy. Does not bruise/bleed easily.  Psychiatric/Behavioral:  Negative for behavioral problems (Depression), sleep disturbance and suicidal ideas. The patient is not nervous/anxious.     Vital Signs: BP 110/85   Pulse 77   Temp 97.6 F (36.4 C)    Resp 16   Ht 5\' 4"  (1.626 m)   Wt 144 lb 9.6 oz (65.6 kg)   SpO2 97%   BMI 24.82 kg/m    Physical Exam Vitals and nursing note reviewed.  Constitutional:      General: She is not in acute distress.    Appearance: Normal appearance. She is well-developed and normal weight. She is not diaphoretic.  HENT:     Head: Normocephalic and atraumatic.     Mouth/Throat:     Pharynx: No oropharyngeal exudate.  Eyes:     Pupils: Pupils are equal, round, and reactive to light.  Neck:     Thyroid: No thyromegaly.     Vascular: No JVD.     Trachea: No tracheal deviation.  Cardiovascular:     Rate and Rhythm: Normal rate and regular rhythm.     Heart sounds: Normal heart sounds. No murmur heard.    No friction rub. No gallop.  Pulmonary:     Effort: Pulmonary effort is normal. No respiratory distress.     Breath sounds: No wheezing or rales.  Chest:     Chest wall: No tenderness.  Abdominal:     General: Bowel sounds are normal.     Palpations: Abdomen is soft.  Musculoskeletal:     Cervical back: Normal range of motion and neck supple.  Lymphadenopathy:     Cervical: No cervical adenopathy.  Skin:    General: Skin is warm and dry.  Neurological:     Mental Status: She is alert and oriented to person, place, and time.     Cranial Nerves: No cranial nerve deficit.     Gait: Gait abnormal.  Psychiatric:        Behavior: Behavior normal.        Thought Content: Thought content normal.        Judgment: Judgment normal.        Assessment/Plan: 1. Primary osteoarthritis of right hip Will move forward with surgery as planned, will stop meloxicam 5 days prior  2. Pre-operative examination Will move forward with surgery as planned, has EKG and labs already ordered for preop testing on Wednesday. Form faxed back to surgery office.   General Counseling: Mary West understanding of the findings of todays visit and agrees with plan of treatment. I have discussed any further  diagnostic evaluation that may be needed or ordered today. We also reviewed her medications today. she has been encouraged to call the office with any questions or concerns that should arise related to todays visit.    No orders of the defined types were placed in this encounter.   No orders of the defined types were placed in this encounter.   This patient was seen by Lynn Ito, PA-C in collaboration with Dr. Beverely Risen as a part of collaborative care agreement.  Total time spent:30 Minutes Time spent includes review of chart, medications, test results, and follow up plan with the patient.      Dr Lyndon Code Internal medicine

## 2022-07-16 ENCOUNTER — Encounter
Admission: RE | Admit: 2022-07-16 | Discharge: 2022-07-16 | Disposition: A | Discharge status: Home / Self Care | Payer: Medicare Other | Source: Ambulatory Visit | Attending: Orthopedic Surgery | Admitting: Orthopedic Surgery

## 2022-07-16 ENCOUNTER — Other Ambulatory Visit: Payer: Self-pay

## 2022-07-16 VITALS — BP 101/71 | HR 72 | Temp 98.3°F | Resp 20 | Ht 65.5 in | Wt 143.0 lb

## 2022-07-16 DIAGNOSIS — Z01818 Encounter for other preprocedural examination: Secondary | ICD-10-CM | POA: Diagnosis not present

## 2022-07-16 DIAGNOSIS — Z01812 Encounter for preprocedural laboratory examination: Secondary | ICD-10-CM

## 2022-07-16 LAB — URINALYSIS, ROUTINE W REFLEX MICROSCOPIC
Bacteria, UA: NONE SEEN
Bilirubin Urine: NEGATIVE
Glucose, UA: NEGATIVE mg/dL
Ketones, ur: NEGATIVE mg/dL
Leukocytes,Ua: NEGATIVE
Nitrite: NEGATIVE
Protein, ur: NEGATIVE mg/dL
Specific Gravity, Urine: 1.004 — ABNORMAL LOW (ref 1.005–1.030)
pH: 6 (ref 5.0–8.0)

## 2022-07-16 LAB — CBC WITH DIFFERENTIAL/PLATELET
Abs Immature Granulocytes: 0.01 10*3/uL (ref 0.00–0.07)
Basophils Absolute: 0 10*3/uL (ref 0.0–0.1)
Basophils Relative: 1 %
Eosinophils Absolute: 0.1 10*3/uL (ref 0.0–0.5)
Eosinophils Relative: 2 %
HCT: 41.6 % (ref 36.0–46.0)
Hemoglobin: 13.5 g/dL (ref 12.0–15.0)
Immature Granulocytes: 0 %
Lymphocytes Relative: 24 %
Lymphs Abs: 0.9 10*3/uL (ref 0.7–4.0)
MCH: 32.1 pg (ref 26.0–34.0)
MCHC: 32.5 g/dL (ref 30.0–36.0)
MCV: 98.8 fL (ref 80.0–100.0)
Monocytes Absolute: 0.3 10*3/uL (ref 0.1–1.0)
Monocytes Relative: 8 %
Neutro Abs: 2.5 10*3/uL (ref 1.7–7.7)
Neutrophils Relative %: 65 %
Platelets: 308 10*3/uL (ref 150–400)
RBC: 4.21 MIL/uL (ref 3.87–5.11)
RDW: 11.8 % (ref 11.5–15.5)
WBC: 3.8 10*3/uL — ABNORMAL LOW (ref 4.0–10.5)
nRBC: 0 % (ref 0.0–0.2)

## 2022-07-16 LAB — COMPREHENSIVE METABOLIC PANEL
ALT: 29 U/L (ref 0–44)
AST: 33 U/L (ref 15–41)
Albumin: 4.3 g/dL (ref 3.5–5.0)
Alkaline Phosphatase: 69 U/L (ref 38–126)
Anion gap: 8 (ref 5–15)
BUN: 8 mg/dL (ref 8–23)
CO2: 26 mmol/L (ref 22–32)
Calcium: 10 mg/dL (ref 8.9–10.3)
Chloride: 103 mmol/L (ref 98–111)
Creatinine, Ser: 0.64 mg/dL (ref 0.44–1.00)
GFR, Estimated: >60 mL/min (ref >60)
Glucose, Bld: 91 mg/dL (ref 70–99)
Potassium: 3.8 mmol/L (ref 3.5–5.1)
Sodium: 137 mmol/L (ref 135–145)
Total Bilirubin: 0.9 mg/dL (ref 0.3–1.2)
Total Protein: 7.1 g/dL (ref 6.5–8.1)

## 2022-07-16 LAB — TYPE AND SCREEN
ABO/RH(D): A POS
Antibody Screen: NEGATIVE

## 2022-07-16 LAB — SURGICAL PCR SCREEN
MRSA, PCR: NEGATIVE
Staphylococcus aureus: NEGATIVE

## 2022-07-16 NOTE — Patient Instructions (Addendum)
Your procedure is scheduled on: Monday July 28, 2022. Report to the Registration Desk on the 1st floor of the [facility]. To find out your arrival time, please call [phone] between 1PM - 3PM on: Friday Jul 25, 2022. If your arrival time is 6:00 am, do not arrive before that time as the [facility] entrance doors do not open until 6:00 am.  REMEMBER: Instructions that are not followed completely may result in serious medical risk, up to and including death; or upon the discretion of your surgeon and anesthesiologist your surgery may need to be rescheduled.  Do not eat food after midnight the night before surgery.  No gum chewing or hard candies.  You may however, drink CLEAR liquids up to 2 hours before you are scheduled to arrive for your surgery. Do not drink anything within 2 hours of your scheduled arrival time.  Clear liquids include: - water  - apple juice without pulp - gatorade (not RED colors) - black coffee or tea (Do NOT add milk or creamers to the coffee or tea) Do NOT drink anything that is not on this list.   In addition, your doctor has ordered for you to drink the provided:  Ensure Pre-Surgery Clear Carbohydrate Drink  Drinking this carbohydrate drink up to two hours before surgery helps to reduce insulin resistance and improve patient outcomes. Please complete drinking 2 hours before scheduled arrival time.  One week prior to surgery: Stop Anti-inflammatories (NSAIDS) such as Advil, Aleve, Ibuprofen, meloxicam (MOBIC), Motrin, Naproxen, Naprosyn and Aspirin based products such as Excedrin, Goody's Powder, BC Powder. Stop ANY ALL THE COUNTER supplements and vitamins until after surgery. You may however, continue to take Tylenol if needed for pain up until the day of surgery.  Continue taking all prescribed medications with the exception of the following: Stop meloxicam (MOBIC) 7.5 MG 1 week prior to surgery (take last dose 07/20/22)  Follow recommendations from  surgeon regarding:To see if he wants you to continue or take it June 1st  ibandronate (BONIVA) 150 MG   TAKE ONLY THESE MEDICATIONS THE MORNING OF SURGERY WITH A SIP OF WATER:  famotidine (PEPCID) 20 MG (take one the night before and the morning of surgery)   No Alcohol for 24 hours before or after surgery.  No Smoking including e-cigarettes for 24 hours before surgery.  No chewable tobacco products for at least 6 hours before surgery.  No nicotine patches on the day of surgery.  Do not use any "recreational" drugs for at least a week (preferably 2 weeks) before your surgery.  Please be advised that the combination of cocaine and anesthesia may have negative outcomes, up to and including death. If you test positive for cocaine, your surgery will be cancelled.  On the morning of surgery brush your teeth with toothpaste and water, you may rinse your mouth with mouthwash if you wish. Do not swallow any toothpaste or mouthwash.  Use CHG Soap or wipes as directed on instruction sheet.  Do not wear jewelry, make-up, hairpins, clips or nail polish.  Do not wear lotions, powders, or perfumes.   Do not shave body hair from the neck down 48 hours before surgery.  Contact lenses, hearing aids and dentures may not be worn into surgery.  Do not bring valuables to the hospital. [facility] is not responsible for any missing/lost belongings or valuables.    Notify your doctor if there is any change in your medical condition (cold, fever, infection).  Wear comfortable  clothing (specific to your surgery type) to the hospital.  After surgery, you can help prevent lung complications by doing breathing exercises.  Take deep breaths and cough every 1-2 hours. Your doctor may order a device called an Incentive Spirometer to help you take deep breaths. When coughing or sneezing, hold a pillow firmly against your incision with both hands. This is called "splinting." Doing this helps protect your  incision. It also decreases belly discomfort.  If you are being admitted to the hospital overnight, leave your suitcase in the car. After surgery it may be brought to your room.  In case of increased patient census, it may be necessary for you, the patient, to continue your postoperative care in the Same Day Surgery department.  If you are being discharged the day of surgery, you will not be allowed to drive home. You will need a responsible individual to drive you home and stay with you for 24 hours after surgery.   If you are taking public transportation, you will need to have a responsible individual with you.  Please call the Pre-admissions Testing Dept. at [phone] if you have any questions about these instructions.  Surgery Visitation Policy:  Patients having surgery or a procedure may have two visitors.  Children under the age of 68 must have an adult with them who is not the patient.  Inpatient Visitation:    Visiting hours are 7 a.m. to 8 p.m. Up to four visitors are allowed at one time in a patient room. The visitors may rotate out with other people during the day.  One visitor age 38 or older may stay with the patient overnight and must be in the room by 8 p.m.    Pre-operative 5 CHG Bath Instructions   You can play a key role in reducing the risk of infection after surgery. Your skin needs to be as free of germs as possible. You can reduce the number of germs on your skin by washing with CHG (chlorhexidine gluconate) soap before surgery. CHG is an antiseptic soap that kills germs and continues to kill germs even after washing.   DO NOT use if you have an allergy to chlorhexidine/CHG or antibacterial soaps. If your skin becomes reddened or irritated, stop using the CHG and notify one of our RNs at [phone].   Please shower with the CHG soap starting 4 days before surgery using the following schedule:     Please keep in mind the following:  DO NOT shave,  including legs and underarms, starting the day of your first shower.   You may shave your face at any point before/day of surgery.  Place clean sheets on your bed the day you start using CHG soap. Use a clean washcloth (not used since being washed) for each shower. DO NOT sleep with pets once you start using the CHG.   CHG Shower Instructions:  If you choose to wash your hair and private area, wash first with your normal shampoo/soap.  After you use shampoo/soap, rinse your hair and body thoroughly to remove shampoo/soap residue.  Turn the water OFF and apply about 3 tablespoons (45 ml) of CHG soap to a CLEAN washcloth.  Apply CHG soap ONLY FROM YOUR NECK DOWN TO YOUR TOES (washing for 3-5 minutes)  DO NOT use CHG soap on face, private areas, open wounds, or sores.  Pay special attention to the area where your surgery is being performed.  If you are having back surgery, having someone wash  your back for you may be helpful. Wait 2 minutes after CHG soap is applied, then you may rinse off the CHG soap.  Pat dry with a clean towel  Put on clean clothes/pajamas   If you choose to wear lotion, please use ONLY the CHG-compatible lotions on the back of this paper.     Additional instructions for the day of surgery: DO NOT APPLY any lotions, deodorants, cologne, or perfumes.   Put on clean/comfortable clothes.  Brush your teeth.  Ask your nurse before applying any prescription medications to the skin.      CHG Compatible Lotions   Aveeno Moisturizing lotion  Cetaphil Moisturizing Cream  Cetaphil Moisturizing Lotion  Clairol Herbal Essence Moisturizing Lotion, Dry Skin  Clairol Herbal Essence Moisturizing Lotion, Extra Dry Skin  Clairol Herbal Essence Moisturizing Lotion, Normal Skin  Curel Age Defying Therapeutic Moisturizing Lotion with Alpha Hydroxy  Curel Extreme Care Body Lotion  Curel Soothing Hands Moisturizing Hand Lotion  Curel Therapeutic Moisturizing Cream, Fragrance-Free   Curel Therapeutic Moisturizing Lotion, Fragrance-Free  Curel Therapeutic Moisturizing Lotion, Original Formula  Eucerin Daily Replenishing Lotion  Eucerin Dry Skin Therapy Plus Alpha Hydroxy Crme  Eucerin Dry Skin Therapy Plus Alpha Hydroxy Lotion  Eucerin Original Crme  Eucerin Original Lotion  Eucerin Plus Crme Eucerin Plus Lotion  Eucerin TriLipid Replenishing Lotion  Keri Anti-Bacterial Hand Lotion  Keri Deep Conditioning Original Lotion Dry Skin Formula Softly Scented  Keri Deep Conditioning Original Lotion, Fragrance Free Sensitive Skin Formula  Keri Lotion Fast Absorbing Fragrance Free Sensitive Skin Formula  Keri Lotion Fast Absorbing Softly Scented Dry Skin Formula  Keri Original Lotion  Keri Skin Renewal Lotion Keri Silky Smooth Lotion  Keri Silky Smooth Sensitive Skin Lotion  Nivea Body Creamy Conditioning Oil  Nivea Body Extra Enriched Lotion  Nivea Body Original Lotion  Nivea Body Sheer Moisturizing Lotion Nivea Crme  Nivea Skin Firming Lotion  NutraDerm 30 Skin Lotion  NutraDerm Skin Lotion  NutraDerm Therapeutic Skin Cream  NutraDerm Therapeutic Skin Lotion  ProShield Protective Hand Cream  Provon moisturizing lotion   How to Use an Incentive Spirometer  An incentive spirometer is a tool that measures how well you are filling your lungs with each breath. Learning to take long, deep breaths using this tool can help you keep your lungs clear and active. This may help to reverse or lessen your chance of developing breathing (pulmonary) problems, especially infection. You may be asked to use a spirometer: After a surgery. If you have a lung problem or a history of smoking. After a long period of time when you have been unable to move or be active. If the spirometer includes an indicator to show the highest number that you have reached, your health care provider or respiratory therapist will help you set a goal. Keep a log of your progress as told by your health care  provider. What are the risks? Breathing too quickly may cause dizziness or cause you to pass out. Take your time so you do not get dizzy or light-headed. If you are in pain, you may need to take pain medicine before doing incentive spirometry. It is harder to take a deep breath if you are having pain. How to use your incentive spirometer  Sit up on the edge of your bed or on a chair. Hold the incentive spirometer so that it is in an upright position. Before you use the spirometer, breathe out normally. Place the mouthpiece in your mouth. Make sure your lips  are closed tightly around it. Breathe in slowly and as deeply as you can through your mouth, causing the piston or the ball to rise toward the top of the chamber. Hold your breath for 3-5 seconds, or for as long as possible. If the spirometer includes a coach indicator, use this to guide you in breathing. Slow down your breathing if the indicator goes above the marked areas. Remove the mouthpiece from your mouth and breathe out normally. The piston or ball will return to the bottom of the chamber. Rest for a few seconds, then repeat the steps 10 or more times. Take your time and take a few normal breaths between deep breaths so that you do not get dizzy or light-headed. Do this every 1-2 hours when you are awake. If the spirometer includes a goal marker to show the highest number you have reached (best effort), use this as a goal to work toward during each repetition. After each set of 10 deep breaths, cough a few times. This will help to make sure that your lungs are clear. If you have an incision on your chest or abdomen from surgery, place a pillow or a rolled-up towel firmly against the incision when you cough. This can help to reduce pain while taking deep breaths and coughing. General tips When you are able to get out of bed: Walk around often. Continue to take deep breaths and cough in order to clear your lungs. Keep using the  incentive spirometer until your health care provider says it is okay to stop using it. If you have been in the hospital, you may be told to keep using the spirometer at home. Contact a health care provider if: You are having difficulty using the spirometer. You have trouble using the spirometer as often as instructed. Your pain medicine is not giving enough relief for you to use the spirometer as told. You have a fever. Get help right away if: You develop shortness of breath. You develop a cough with bloody mucus from the lungs. You have fluid or blood coming from an incision site after you cough. Summary An incentive spirometer is a tool that can help you learn to take long, deep breaths to keep your lungs clear and active. You may be asked to use a spirometer after a surgery, if you have a lung problem or a history of smoking, or if you have been inactive for a long period of time. Use your incentive spirometer as instructed every 1-2 hours while you are awake. If you have an incision on your chest or abdomen, place a pillow or a rolled-up towel firmly against your incision when you cough. This will help to reduce pain. Get help right away if you have shortness of breath, you cough up bloody mucus, or blood comes from your incision when you cough. This information is not intended to replace advice given to you by your health care provider. Make sure you discuss any questions you have with your health care provider. Document Revised: 05/02/2019 Document Reviewed: 05/02/2019 Elsevier Patient Education  2023 [employer].    Please go to the following website to access important education materials concerning your upcoming joint replacement.                                   [email or web address]

## 2022-07-22 ENCOUNTER — Telehealth: Payer: Self-pay | Admitting: Physician Assistant

## 2022-07-22 NOTE — Telephone Encounter (Signed)
Received call from [facility] regarding surgical clearance form. I printed from media, faxed to them; [phone]

## 2022-07-28 ENCOUNTER — Ambulatory Visit: Payer: Medicare Other | Admitting: Registered Nurse

## 2022-07-28 ENCOUNTER — Encounter
Admission: RE | Disposition: A | Discharge status: Home Health | Payer: Self-pay | Source: Home / Self Care | Attending: Orthopedic Surgery

## 2022-07-28 ENCOUNTER — Other Ambulatory Visit: Payer: Self-pay

## 2022-07-28 ENCOUNTER — Observation Stay
Admission: RE | Admit: 2022-07-28 | Discharge: 2022-07-29 | Disposition: A | Discharge status: Home Health | Payer: Medicare Other | Attending: Orthopedic Surgery | Admitting: Orthopedic Surgery

## 2022-07-28 ENCOUNTER — Encounter: Payer: Self-pay | Admitting: Orthopedic Surgery

## 2022-07-28 ENCOUNTER — Ambulatory Visit: Payer: Medicare Other

## 2022-07-28 DIAGNOSIS — Z471 Aftercare following joint replacement surgery: Secondary | ICD-10-CM | POA: Diagnosis not present

## 2022-07-28 DIAGNOSIS — Z01818 Encounter for other preprocedural examination: Secondary | ICD-10-CM | POA: Insufficient documentation

## 2022-07-28 DIAGNOSIS — Z96641 Presence of right artificial hip joint: Secondary | ICD-10-CM | POA: Diagnosis not present

## 2022-07-28 DIAGNOSIS — M1611 Unilateral primary osteoarthritis, right hip: Principal | ICD-10-CM | POA: Insufficient documentation

## 2022-07-28 HISTORY — PX: TOTAL HIP ARTHROPLASTY: SHX124

## 2022-07-28 LAB — ABO/RH: ABO/RH(D): A POS

## 2022-07-28 SURGERY — ARTHROPLASTY, HIP, TOTAL, ANTERIOR APPROACH
Anesthesia: Spinal | Site: Hip | Laterality: Right

## 2022-07-28 MED ORDER — ONDANSETRON HCL 4 MG/2ML IJ SOLN: 4.0000 mg | Freq: Four times a day (QID) | INTRAMUSCULAR | Status: DC | PRN

## 2022-07-28 MED ORDER — SODIUM CHLORIDE 0.9 % IV SOLN
INTRAVENOUS | Status: AC | PRN
  Administered 2022-07-28: 100 mL

## 2022-07-28 MED ORDER — ACETAMINOPHEN 500 MG PO TABS
1000.0000 mg | ORAL_TABLET | Freq: Three times a day (TID) | ORAL | Status: DC
  Administered 2022-07-28 – 2022-07-29 (×2): 1000 mg via ORAL

## 2022-07-28 MED ORDER — ACETAMINOPHEN 10 MG/ML IV SOLN
INTRAVENOUS | Status: DC | PRN
  Administered 2022-07-28: 1000 mg via INTRAVENOUS

## 2022-07-28 MED ORDER — SODIUM CHLORIDE (PF) 0.9 % IJ SOLN
INTRAMUSCULAR | Status: DC | PRN
  Administered 2022-07-28: 50 mL via PERIARTICULAR

## 2022-07-28 MED ORDER — TRANEXAMIC ACID 1000 MG/10ML IV SOLN
INTRAVENOUS | Status: AC
  Filled 2022-07-28: qty 10

## 2022-07-28 MED ORDER — FENTANYL CITRATE (PF) 100 MCG/2ML IJ SOLN: 25.0000 ug | INTRAMUSCULAR | Status: DC | PRN

## 2022-07-28 MED ORDER — HYDROCODONE-ACETAMINOPHEN 5-325 MG PO TABS: 1.0000 | ORAL_TABLET | ORAL | Status: DC | PRN

## 2022-07-28 MED ORDER — ONDANSETRON HCL 4 MG/2ML IJ SOLN
INTRAMUSCULAR | Status: DC | PRN
  Administered 2022-07-28: 4 mg via INTRAVENOUS

## 2022-07-28 MED ORDER — ACETAMINOPHEN 10 MG/ML IV SOLN: 1000.0000 mg | Freq: Once | INTRAVENOUS | Status: DC | PRN

## 2022-07-28 MED ORDER — CHLORHEXIDINE GLUCONATE 0.12 % MT SOLN
15.0000 mL | Freq: Once | OROMUCOSAL | Status: AC
  Administered 2022-07-28: 15 mL via OROMUCOSAL

## 2022-07-28 MED ORDER — EPINEPHRINE PF 1 MG/ML IJ SOLN
INTRAMUSCULAR | Status: AC
  Filled 2022-07-28: qty 1

## 2022-07-28 MED ORDER — DEXAMETHASONE SODIUM PHOSPHATE 10 MG/ML IJ SOLN
INTRAMUSCULAR | Status: AC
  Filled 2022-07-28: qty 1

## 2022-07-28 MED ORDER — PANTOPRAZOLE SODIUM 40 MG PO TBEC
40.0000 mg | DELAYED_RELEASE_TABLET | Freq: Every day | ORAL | Status: DC
  Administered 2022-07-29: 40 mg via ORAL

## 2022-07-28 MED ORDER — DEXAMETHASONE SODIUM PHOSPHATE 10 MG/ML IJ SOLN
8.0000 mg | Freq: Once | INTRAMUSCULAR | Status: AC
  Administered 2022-07-28: 8 mg via INTRAVENOUS

## 2022-07-28 MED ORDER — BUPIVACAINE LIPOSOME 1.3 % IJ SUSP
INTRAMUSCULAR | Status: AC
  Filled 2022-07-28: qty 20

## 2022-07-28 MED ORDER — PHENYLEPHRINE HCL (PRESSORS) 10 MG/ML IV SOLN
INTRAVENOUS | Status: DC | PRN
  Administered 2022-07-28 (×2): 80 ug via INTRAVENOUS
  Administered 2022-07-28: 40 ug via INTRAVENOUS
  Administered 2022-07-28 (×2): 80 ug via INTRAVENOUS
  Administered 2022-07-28: 40 ug via INTRAVENOUS
  Administered 2022-07-28: 80 ug via INTRAVENOUS

## 2022-07-28 MED ORDER — BUPIVACAINE HCL (PF) 0.5 % IJ SOLN
INTRAMUSCULAR | Status: AC
  Filled 2022-07-28: qty 10

## 2022-07-28 MED ORDER — PHENOL 1.4 % MT LIQD: 1.0000 | OROMUCOSAL | Status: DC | PRN

## 2022-07-28 MED ORDER — ONDANSETRON HCL 4 MG PO TABS: 4.0000 mg | ORAL_TABLET | Freq: Four times a day (QID) | ORAL | Status: DC | PRN

## 2022-07-28 MED ORDER — FENTANYL CITRATE (PF) 100 MCG/2ML IJ SOLN
INTRAMUSCULAR | Status: AC
  Filled 2022-07-28: qty 2

## 2022-07-28 MED ORDER — BUPIVACAINE HCL (PF) 0.25 % IJ SOLN
INTRAMUSCULAR | Status: AC
  Filled 2022-07-28: qty 30

## 2022-07-28 MED ORDER — PHENYLEPHRINE HCL-NACL 20-0.9 MG/250ML-% IV SOLN
INTRAVENOUS | Status: DC | PRN
  Administered 2022-07-28: 20 ug/min via INTRAVENOUS

## 2022-07-28 MED ORDER — SODIUM CHLORIDE FLUSH 0.9 % IV SOLN
INTRAVENOUS | Status: AC
  Filled 2022-07-28: qty 10

## 2022-07-28 MED ORDER — OXYCODONE HCL 5 MG PO TABS
ORAL_TABLET | ORAL | Status: AC
  Filled 2022-07-28: qty 1

## 2022-07-28 MED ORDER — DOCUSATE SODIUM 100 MG PO CAPS
ORAL_CAPSULE | ORAL | Status: AC
  Filled 2022-07-28: qty 1

## 2022-07-28 MED ORDER — KETOROLAC TROMETHAMINE 15 MG/ML IJ SOLN
INTRAMUSCULAR | Status: AC
  Filled 2022-07-28: qty 1

## 2022-07-28 MED ORDER — PROPOFOL 10 MG/ML IV BOLUS
INTRAVENOUS | Status: DC | PRN
  Administered 2022-07-28: 30 mg via INTRAVENOUS
  Administered 2022-07-28: 20 mg via INTRAVENOUS

## 2022-07-28 MED ORDER — OXYCODONE HCL 5 MG/5ML PO SOLN: 5.0000 mg | Freq: Once | ORAL | Status: AC | PRN

## 2022-07-28 MED ORDER — DOCUSATE SODIUM 100 MG PO CAPS
100.0000 mg | ORAL_CAPSULE | Freq: Two times a day (BID) | ORAL | Status: DC
  Administered 2022-07-28 – 2022-07-29 (×2): 100 mg via ORAL

## 2022-07-28 MED ORDER — METOCLOPRAMIDE HCL 10 MG PO TABS: 5.0000 mg | ORAL_TABLET | Freq: Three times a day (TID) | ORAL | Status: DC | PRN

## 2022-07-28 MED ORDER — LACTATED RINGERS IV SOLN: INTRAVENOUS | Status: DC

## 2022-07-28 MED ORDER — CEFAZOLIN SODIUM-DEXTROSE 2-4 GM/100ML-% IV SOLN
2.0000 g | Freq: Four times a day (QID) | INTRAVENOUS | Status: AC
  Administered 2022-07-28 (×2): 2 g via INTRAVENOUS

## 2022-07-28 MED ORDER — MIDAZOLAM HCL 2 MG/2ML IJ SOLN
INTRAMUSCULAR | Status: AC
  Filled 2022-07-28: qty 2

## 2022-07-28 MED ORDER — SURGIPHOR WOUND IRRIGATION SYSTEM - OPTIME
TOPICAL | Status: DC | PRN
  Administered 2022-07-28: 450 mL via TOPICAL

## 2022-07-28 MED ORDER — TRANEXAMIC ACID-NACL 1000-0.7 MG/100ML-% IV SOLN
1000.0000 mg | INTRAVENOUS | Status: AC
  Administered 2022-07-28 (×2): 1000 mg via INTRAVENOUS

## 2022-07-28 MED ORDER — CEFAZOLIN SODIUM-DEXTROSE 2-4 GM/100ML-% IV SOLN
2.0000 g | INTRAVENOUS | Status: AC
  Administered 2022-07-28: 2 g via INTRAVENOUS

## 2022-07-28 MED ORDER — CEFAZOLIN SODIUM-DEXTROSE 2-4 GM/100ML-% IV SOLN
INTRAVENOUS | Status: AC
  Filled 2022-07-28: qty 100

## 2022-07-28 MED ORDER — ONDANSETRON HCL 4 MG/2ML IJ SOLN: 4.0000 mg | Freq: Once | INTRAMUSCULAR | Status: DC | PRN

## 2022-07-28 MED ORDER — EPHEDRINE SULFATE (PRESSORS) 50 MG/ML IJ SOLN: INTRAMUSCULAR | Status: DC | PRN

## 2022-07-28 MED ORDER — TRANEXAMIC ACID-NACL 1000-0.7 MG/100ML-% IV SOLN
INTRAVENOUS | Status: AC
  Filled 2022-07-28: qty 100

## 2022-07-28 MED ORDER — CHLORHEXIDINE GLUCONATE 0.12 % MT SOLN
OROMUCOSAL | Status: AC
  Filled 2022-07-28: qty 15

## 2022-07-28 MED ORDER — ORAL CARE MOUTH RINSE: 15.0000 mL | Freq: Once | OROMUCOSAL | Status: AC

## 2022-07-28 MED ORDER — ACETAMINOPHEN 10 MG/ML IV SOLN
INTRAVENOUS | Status: AC
  Filled 2022-07-28: qty 100

## 2022-07-28 MED ORDER — ENOXAPARIN SODIUM 40 MG/0.4ML IJ SOSY
40.0000 mg | PREFILLED_SYRINGE | INTRAMUSCULAR | Status: DC
  Administered 2022-07-29: 40 mg via SUBCUTANEOUS

## 2022-07-28 MED ORDER — ORAL CARE MOUTH RINSE: 15.0000 mL | OROMUCOSAL | Status: DC | PRN

## 2022-07-28 MED ORDER — KETOROLAC TROMETHAMINE 15 MG/ML IJ SOLN
7.5000 mg | Freq: Four times a day (QID) | INTRAMUSCULAR | Status: AC
  Administered 2022-07-28 – 2022-07-29 (×3): 7.5 mg via INTRAVENOUS

## 2022-07-28 MED ORDER — MIDAZOLAM HCL 5 MG/5ML IJ SOLN
INTRAMUSCULAR | Status: DC | PRN
  Administered 2022-07-28: 2 mg via INTRAVENOUS

## 2022-07-28 MED ORDER — METOCLOPRAMIDE HCL 5 MG/ML IJ SOLN: 5.0000 mg | Freq: Three times a day (TID) | INTRAMUSCULAR | Status: DC | PRN

## 2022-07-28 MED ORDER — SODIUM CHLORIDE 0.9 % IV SOLN: INTRAVENOUS | Status: DC

## 2022-07-28 MED ORDER — 0.9 % SODIUM CHLORIDE (POUR BTL) OPTIME
TOPICAL | Status: DC | PRN
  Administered 2022-07-28: 500 mL

## 2022-07-28 MED ORDER — PROPOFOL 1000 MG/100ML IV EMUL
INTRAVENOUS | Status: AC
  Filled 2022-07-28: qty 100

## 2022-07-28 MED ORDER — MENTHOL 3 MG MT LOZG: 1.0000 | LOZENGE | OROMUCOSAL | Status: DC | PRN

## 2022-07-28 MED ORDER — BUPIVACAINE HCL (PF) 0.5 % IJ SOLN
INTRAMUSCULAR | Status: DC | PRN
  Administered 2022-07-28: 2.4 mL

## 2022-07-28 MED ORDER — PROPOFOL 500 MG/50ML IV EMUL
INTRAVENOUS | Status: DC | PRN
  Administered 2022-07-28: 100 ug/kg/min via INTRAVENOUS

## 2022-07-28 MED ORDER — OXYCODONE HCL 5 MG PO TABS
5.0000 mg | ORAL_TABLET | Freq: Once | ORAL | Status: AC | PRN
  Administered 2022-07-28: 5 mg via ORAL

## 2022-07-28 MED ORDER — ONDANSETRON HCL 4 MG/2ML IJ SOLN
INTRAMUSCULAR | Status: AC
  Filled 2022-07-28: qty 2

## 2022-07-28 MED ORDER — TRAMADOL HCL 50 MG PO TABS: 50.0000 mg | ORAL_TABLET | Freq: Four times a day (QID) | ORAL | Status: DC | PRN

## 2022-07-28 MED ORDER — MORPHINE SULFATE (PF) 4 MG/ML IV SOLN: 0.5000 mg | INTRAVENOUS | Status: DC | PRN

## 2022-07-28 MED ORDER — ACETAMINOPHEN 500 MG PO TABS
ORAL_TABLET | ORAL | Status: AC
  Filled 2022-07-28: qty 2

## 2022-07-28 SURGICAL SUPPLY — 71 items
ADH SKN CLS APL DERMABOND .7 (GAUZE/BANDAGES/DRESSINGS) ×1
AGENT HMST KT MTR STRL THRMB (HEMOSTASIS)
APL PRP STRL LF DISP 70% ISPRP (MISCELLANEOUS) ×1
BLADE SAGITTAL AGGR TOOTH XLG (BLADE) ×1 IMPLANT
BNDG CMPR 5X6 CHSV STRCH STRL (GAUZE/BANDAGES/DRESSINGS) ×1
BNDG COHESIVE 6X5 TAN ST LF (GAUZE/BANDAGES/DRESSINGS) ×1 IMPLANT
CHLORAPREP W/TINT 26 (MISCELLANEOUS) ×1 IMPLANT
COVER BACK TABLE REUSABLE LG (DRAPES) ×1 IMPLANT
DERMABOND ADVANCED .7 DNX12 (GAUZE/BANDAGES/DRESSINGS) ×1 IMPLANT
DRAPE 3/4 80X56 (DRAPES) ×1 IMPLANT
DRAPE C-ARM XRAY 36X54 (DRAPES) ×1 IMPLANT
DRAPE POUCH INSTRU U-SHP 10X18 (DRAPES) ×1 IMPLANT
DRSG MEPILEX SACRM 8.7X9.8 (GAUZE/BANDAGES/DRESSINGS) ×1 IMPLANT
DRSG OPSITE POSTOP 4X8 (GAUZE/BANDAGES/DRESSINGS) ×1 IMPLANT
ELECT BLADE 4.0 EZ CLEAN MEGAD (MISCELLANEOUS) ×1
ELECT REM PT RETURN 9FT ADLT (ELECTROSURGICAL) ×1
ELECTRODE BLDE 4.0 EZ CLN MEGD (MISCELLANEOUS) ×1 IMPLANT
ELECTRODE REM PT RTRN 9FT ADLT (ELECTROSURGICAL) ×1 IMPLANT
GLOVE BIO SURGEON STRL SZ8 (GLOVE) ×1 IMPLANT
GLOVE BIOGEL PI IND STRL 8 (GLOVE) ×1 IMPLANT
GLOVE PI ORTHO PRO STRL 7.5 (GLOVE) ×2 IMPLANT
GLOVE PI ORTHO PRO STRL SZ8 (GLOVE) ×2 IMPLANT
GLOVE SURG SYN 7.5 E (GLOVE) ×1 IMPLANT
GLOVE SURG SYN 7.5 PF PI (GLOVE) ×1 IMPLANT
GOWN SRG XL LVL 3 NONREINFORCE (GOWNS) ×1 IMPLANT
GOWN STRL NON-REIN TWL XL LVL3 (GOWNS) ×1
GOWN STRL REUS W/ TWL LRG LVL3 (GOWN DISPOSABLE) ×1 IMPLANT
GOWN STRL REUS W/ TWL XL LVL3 (GOWN DISPOSABLE) ×1 IMPLANT
GOWN STRL REUS W/TWL LRG LVL3 (GOWN DISPOSABLE) ×1
GOWN STRL REUS W/TWL XL LVL3 (GOWN DISPOSABLE) ×1
HANDLE YANKAUER SUCT OPEN TIP (MISCELLANEOUS) ×1 IMPLANT
HEAD CERAMIC V40 BIOLOX DEL 28 (Orthopedic Implant) IMPLANT
HOOD PEEL AWAY T7 (MISCELLANEOUS) ×2 IMPLANT
IV NS 100ML SINGLE PACK (IV SOLUTION) ×1 IMPLANT
KIT PATIENT CARE HANA TABLE (KITS) ×1 IMPLANT
LIGHT WAVEGUIDE WIDE FLAT (MISCELLANEOUS) ×1 IMPLANT
LINER 42MM E (Orthopedic Implant) IMPLANT
LINER ADM MDM INS 28/48 42E (Liner) IMPLANT
MANIFOLD NEPTUNE II (INSTRUMENTS) ×1 IMPLANT
MARKER SKIN DUAL TIP RULER LAB (MISCELLANEOUS) ×1 IMPLANT
MAT ABSORB FLUID 56X50 GRAY (MISCELLANEOUS) ×1 IMPLANT
NDL FILTER BLUNT 18X1 1/2 (NEEDLE) ×1 IMPLANT
NDL SAFETY ECLIP 18X1.5 (MISCELLANEOUS) ×1 IMPLANT
NDL SPNL 20GX3.5 QUINCKE YW (NEEDLE) ×1 IMPLANT
NEEDLE FILTER BLUNT 18X1 1/2 (NEEDLE) ×1 IMPLANT
NEEDLE SPNL 20GX3.5 QUINCKE YW (NEEDLE) ×1 IMPLANT
NS IRRIG 500ML POUR BTL (IV SOLUTION) ×1 IMPLANT
PACK HIP COMPR (MISCELLANEOUS) ×1 IMPLANT
PAD ARMBOARD 7.5X6 YLW CONV (MISCELLANEOUS) ×1 IMPLANT
SCREW HEX LP 6.5X20 (Screw) IMPLANT
SCREW HEX LP 6.5X40 (Screw) IMPLANT
SHELL CLUSTERHOLE ACETABULAR 5 (Shell) IMPLANT
SLEEVE SCD COMPRESS KNEE MED (STOCKING) ×1 IMPLANT
SOLUTION IRRIG SURGIPHOR (IV SOLUTION) ×1 IMPLANT
STEM HIGH OFFSET SZ4 36X103 (Stem) IMPLANT
SURGIFLO W/THROMBIN 8M KIT (HEMOSTASIS) IMPLANT
SUT BONE WAX W31G (SUTURE) ×1 IMPLANT
SUT DVC 2 QUILL PDO T11 36X36 (SUTURE) ×1 IMPLANT
SUT ETHIBOND 2 V 37 (SUTURE) ×1 IMPLANT
SUT QUILL MONODERM 3-0 PS-2 (SUTURE) ×1 IMPLANT
SUT SILK 0 (SUTURE) ×1
SUT SILK 0 30XBRD TIE 6 (SUTURE) ×1 IMPLANT
SUT VIC AB 0 CT1 36 (SUTURE) ×1 IMPLANT
SUT VIC AB 2-0 CT2 27 (SUTURE) ×2 IMPLANT
SYR 30ML LL (SYRINGE) ×2 IMPLANT
SYR TB 1ML LL NO SAFETY (SYRINGE) ×1 IMPLANT
TAPE MICROFOAM 4IN (TAPE) IMPLANT
TOWEL OR 17X26 4PK STRL BLUE (TOWEL DISPOSABLE) IMPLANT
TRAP FLUID SMOKE EVACUATOR (MISCELLANEOUS) ×1 IMPLANT
WAND WEREWOLF FASTSEAL 6.0 (MISCELLANEOUS) ×1 IMPLANT
WATER STERILE IRR 1000ML POUR (IV SOLUTION) ×1 IMPLANT

## 2022-07-28 NOTE — Op Note (Signed)
Patient Name: Mary West  ZOX:[id number]  Pre-Operative Diagnosis: Right hip Osteoarthritis  Post-Operative Diagnosis: (same)  Procedure: Right Total Hip Arthroplasty  Components/Implants: Cup: Trident Tritanium 25mm/E clusterhole w/ x2 screws    Liner: MDM 42/E  Stem: Insignia #4 high offset  Head:ADM/MDM 28/48 X3 poly with biolox 28 +31mm ball  Date of Surgery: 07/28/2022  Surgeon: Reinaldo Berber MD  Assistant: Amador Cunas PA (present and scrubbed throughout the case, critical for assistance with exposure, retraction, instrumentation, and closure)   Anesthesiologist: Suzan Slick  Anesthesia: Spinal   EBL: 150cc  IVF:1000cc  Complications: None   Brief history: The patient is a 67 year old female with a history of osteoarthritis of the right hip with pain limiting their range of motion and activities of daily living, which has failed multiple attempts at conservative therapy.  The risks and benefits of total hip arthroplasty as definitive surgical treatment were discussed with the patient, who opted to proceed with the operation.  After outpatient medical clearance and optimization was completed the patient was admitted to [facility] for the procedure.  All preoperative films were reviewed and an appropriate surgical plan was made prior to surgery.   Description of procedure: The patient was brought to the operating room where laterality was confirmed by all those present to be the right side.  The patient was administered spinal anesthesia on a stretcher prior to being moved supine on the operating room table. Patient was given an intravenous dose of antibiotics for surgical prophylaxis and TXA.  All bony prominences and extremities were well padded and the patient was securely attached to the table boots, a perineal post was placed and the patient had a safety strap placed.  Surgical site was prepped with alcohol and chlorhexidine. The surgical site over the hip was  and draped in typical sterile fashion with multiple layers of adhesive and nonadhesive drapes.  The incision site was marked out with a sterile marker and care was taken to assess the position of the ASIS and ensure appropriate position for the incision.    A surgical timeout was then called with participation of all staff in the room the patient was then a confirmed again and laterality confirmed.  Incision was made over the anterior lateral aspect of the proximal thigh in line with the TFL.  Appropriate retractors were placed and all bleeding vessels were coagulated within the subcutaneous and fatty layers.  An incision was made in the TFL fascia in the interval was carefully identified.  The lateral ascending branches of the circumflex vessels were identified, cauterized and carefully dissected. The main vessels were then tied with a 0 silk hand tie.  Retractors were placed around the superior lateral and inferior medial aspects of the femoral neck and a capsulotomy was performed exposing the hip joint.  Retraction stitches were placed and the capsulotomy to assist with visualization.  Femoral neck cut was then made and the femoral head was extracted after placing the leg in traction.  Bone wax was then applied to the proximal cut surface of the femur and aqua mantis was used to address any bleeding around the femoral neck cut.  Retractors were then placed around the acetabulum to fully visualize the joint space, and the remaining labral tissue was removed and pulvinar was removed.   The acetabulum was then sequentially reamed up to the appropriate size in order to get good fit and fill for the acetabular component while under fluoroscopic guidance.  Acetabular component was then placed  and malleted into a secure fit while confirming position and abduction angle and anteversion utilizing fluoroscopy.  There was a noted cyst at the lateral aspect of the cup in the acetabulum but it did not affect positioing  or bite of the cup. 2 screws were then placed in the acetabular cup to assist in securing the cup in place.  The cup was irrigated and a real MDM liner was placed, impacted, and checked for stability. The femur traction was dropped and sequentially externally rotated while performing a release of the posterior and superomedial tissues off of the proximal femur to allow for mobility, care was taken to preserve the external rotators and piriformis attachments.  The remaining interval between the abductors and the capsule was dissected out and a retractor was placed over the superolateral aspect of the femur over the greater trochanter.  The leg was carefully brought down into extension and adducted to provide visualization of the proximal femur for broaching.  There was some cystic changes noted base of the greater trochanter on the medial side, no fractures noted. The femur was then sequentially broached up to an appropriate size which provided for good fill and stability to the femoral broach.  A trial neck and head were placed on the femoral broach and the leg was brought up for reduction.  The hip was reduced and manual check of stability was performed with the boot detached from the table.  The hip was found to be stable in flexion internal rotation and extension external rotation.  Leg lengths were confirmed on fluoroscopy.   The hip was then dislocated the trial neck and head were removed. The leg was then brought down into extension and adduction in the proximal femur was reexposed.  The broach trial was removed and the femur was irrigated with normal saline prior to the real femoral stem being implanted.  After the femoral stem was seated and shown to have good fit and fill the appropriate head was impacted the leg was brought up and reduced.  There was good range of motion with stability in flexion internal rotation and extension external rotation on testing.  Leg lengths were found to be appropriate on  fluoroscopic evaluation at this time.  The hip was then irrigated with betdine based surgiphor solution and then saline solution.  The capsulotomy was repaired with Ethibond sutures.  A pericapsular and peritrochanteric cocktail with Exparel and bupivacaine was then injected as well as the subcutaneous tissues. The fascia was closed with a #2 barbed running suture.  The deep tissues were closed with Vicryl sutures the subcutaneous tissues were closed with interrupted Vicryl sutures and a running barbed 3-0 suture.  The skin was then reinforced with Dermabond and a sterile dressing was placed.   The patient was awoken from anesthesia transferred off of the operating room table onto a hospital bed where examination of leg lengths found the leg lengths to be equal with a good distal pulse.  The patient was then transferred to the PACU in stable condition.

## 2022-07-28 NOTE — Interval H&P Note (Signed)
Patient history and physical updated. Consent reviewed including risks, benefits, and alternatives to surgery. Patient agrees with above plan to proceed with right hip anterior total hip replacement

## 2022-07-28 NOTE — Anesthesia Procedure Notes (Signed)
Spinal  Patient location during procedure: OR Start time: 07/28/2022 10:47 AM End time: 07/28/2022 10:52 AM Reason for block: surgical anesthesia Staffing Performed: resident/CRNA  Anesthesiologist: Corinda Gubler, MD Resident/CRNA: Daziya Redmond, [place], CRNA Performed by: Valon Glasscock, [place], CRNA Authorized by: Corinda Gubler, MD   Preanesthetic Checklist Completed: patient identified, IV checked, site marked, risks and benefits discussed, surgical consent, monitors and equipment checked, pre-op evaluation and timeout performed Spinal Block Patient position: sitting Prep: Betadine Patient monitoring: heart rate, continuous pulse ox, blood pressure and cardiac monitor Approach: midline Location: L4-5 Injection technique: single-shot Needle Needle type: Whitacre and Introducer  Needle gauge: 24 G Needle length: 9 cm Assessment Sensory level: T6 Events: CSF return Additional Notes Negative paresthesia. Negative blood return. Positive free-flowing CSF. Expiration date of kit checked and confirmed. Patient tolerated procedure well, without complications.

## 2022-07-28 NOTE — H&P (Signed)
History of Present Illness: Mary West is an 67 y.o. female presents for follow-up evaluation of her right hip and review of her CT scan. Patient reports she has had pain for about 3 years in her right anterior thigh which was initially thought to be coming from her back. 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 During this period of time she has had increasing stiffness and slow fixed external rotation of her right leg. She describes the pain as aching throbbing in her anterior thigh worse with walking getting up and down from a sitting position. She reports this pain feels significantly different than the sciatica pain she has had in the past. She reports the pain gets up to an 8 out of 10 at worst she is been treated with Tylenol and meloxicam and has done physical therapy for it without improvement. The patient has not had any hip injections at this time. She denies any childhood injuries or recent injuries to her hip. She denies any recent infections. The patient denies fevers, chills, numbness, tingling, shortness of breath, chest pain, recent illness, or any trauma.  The patient is a non-smoker, nondiabetic with a BMI of 23.8 Hemoglobin A1c is 5.5, ESR was 11, CRP less than 1  Previous available records reviewed  Past Medical History: No past medical history on file.  Past Surgical History: History reviewed. No pertinent surgical history.  Past Family History: History reviewed. No pertinent family history.  Medications: Current Outpatient Medications  Medication Sig Dispense Refill  alendronate (FOSAMAX) 70 MG tablet patient states she take this every other week  calcium carbonate 600 mg calcium (1,500 mg) Tab tablet Take by mouth  ibandronate (BONIVA) 150 mg tablet TAKE 1 TABLET IN AM ONCE A MONTH ON EMPTY STOMACH. DO NOT TAKE ANYTHING ELSE BY MOUTH OR LIE DOWN FOR THE NEXT 30 MINUTES  meloxicam  (MOBIC) 7.5 MG tablet Take 1 tablet by mouth every 12 (twelve) hours  multivit with minerals/lutein (MULTIVITAMIN 50 PLUS ORAL) Take by mouth   No current facility-administered medications for this visit.   Allergies: Allergies  Allergen Reactions  Adhesive Other (See Comments)  Adhesive Tape-Silicones Rash  Red skin    Visit Vitals: Vitals:  07/08/22 1411  BP: 114/68    Review of Systems:  A comprehensive 14 point ROS was performed, reviewed, and the pertinent orthopaedic findings are documented in the HPI.  Physical Exam: Body mass index is 24.1 kg/m. General/Constitutional: No apparent distress: well-nourished and well developed. Lymphatic: No palpable adenopathy. Pulmonary exam: Lungs clear to auscultation bilaterally no wheezing rales or rhonchi Cardiac exam: Regular rate and rhythm no obvious murmurs rubs or gallops. Vascular: No edema, swelling or tenderness, except as noted in detailed exam. Integumentary: No impressive skin lesions present, except as noted in detailed exam. Neuro/Psych: Normal mood and affect, oriented to person, place and time. Musculoskeletal: Normal, except as noted in detailed exam and in HPI.  Right hip exam  SKIN: intact SWELLING: none WARMTH: no warmth TENDERNESS: none, Stinchfield Positive ROM: -15 degrees internal rotation and 30 degrees external rotation and pain with internal rotation, localized to the anterior thigh; Hip Flexion 80 degrees with a hard endpoint STRENGTH: normal GAIT: antalgic and stiff-legged STABILITY: stable to testing CREPITUS: yes LEG LENGTH DISCREPANCY: left longer by 1 cm NEUROLOGICAL EXAM: normal VASCULAR EXAM: normal LUMBAR SPINE: tenderness: Mild paraspinal tenderness straight leg raising sign: no motor exam: normal  The contralateral hip was examined for comparison and it showed: TENDERNESS: none ROM: normal and full STRENGTH: normal STABILITY: stable to testing  Hip Imaging :  I have reviewed  AP pelvis and lateral hip X-rays (4 views) taken at the previous office visit on 06/11/2022 of the right hip which reveal severe degenerative changes with joint space narrowing, osteophyte formation, subchondral cysts and sclerosis of the right hip. There is severe deformity of the right hip with femoral head changes with cystic changes, and lateralization of hip center. There is also significant cystic change to the superior acetabular dome. the left hip is noted to have mild degenerative changes with superior acetabular sclerosis.   CT of the right hip without contrast performed on 06/30/2022 images and report reviewed by myself. There is severe degenerative changes of the right hip with lateralization of the hip and excessive anteversion. Periacetabular cysts and cyst within the femoral head are noted along with extensive osteophyte formation. There is intact posterior column and anterior column bone with some cystic loss. Agree with radiologist interpretation.  Assessment:  Right hip osteoarthritis  Plan: Mary West is a 67 year old female who presents with right hip severe bone-on-bone arthritis with hip deformity. Based upon the patient's continued symptoms and failure to respond to conservative treatment, I have recommended a right total hip replacement for this patient. A long discussion took place with the patient describing what a total joint replacement is and what the procedure would entail. A hip model, similar to the implants that will be used during the operation, was utilized to demonstrate the implants. Choices of implant manufactures were discussed and reviewed. The ability to secure the implant utilizing cement or cementless (press fit) fixation was discussed. Anterior and posterior exposures were discussed. For this patient an appropriate approach will be anterior.  The hospitalization and post-operative care and rehabilitation were also discussed. The use of perioperative antibiotics and DVT  prophylaxis were discussed. The risk, benefits and alternatives to a surgical intervention were discussed at length with the patient. The patient was also advised of risks related to the medical comorbidities and elevated body mass index (BMI). A lengthy discussion took place to review the most common complications including but not limited to: deep vein thrombosis, pulmonary embolus, heart attack, stroke, infection, wound breakdown, heterotopic ossification, dislocation, numbness, leg length in-equality, intraoperative fracture, damage to nerves, tendon,muscles, arteries or other blood vessels, death and other possible complications from anesthesia. The patient was told that we will take steps to minimize these risks by using sterile technique, antibiotics and DVT prophylaxis when appropriate and follow the patient postoperatively in the office setting to monitor progress. The possibility of recurrent pain, no improvement in pain and actual worsening of pain were also discussed with the patient. The risk of dislocation following total hip replacement was discussed and potential precautions to prevent dislocation were reviewed. We had a specific conversation about her increased risk of sciatic nerve palsy due to her significant leg shortening and the amount we will be lengthening her hip after surgery. We also discussed bone grafting with autologous bone as neccessary in the acetabular cysts if determined to be needed during surgery.   The discharge plan of care focused on the patient going home following surgery. The patient was encouraged to make the necessary arrangements to have someone stay with them when they are discharged home.   The benefits of surgery were discussed with the patient including the potential for improving the patient's current clinical condition through operative intervention.  Alternatives to surgical intervention including continued conservative management were also discussed in detail.  All questions were answered to the satisfaction of the patient. The patient participated and agreed to the plan of care as well as the use of the recommended implants for their total hip replacement surgery. An information packet was given to the patient to review prior to surgery.   The patient received medical clearance for surgery. All questions answered and the patient agrees the above plan proceed with right anterior total hip replacement.  Portions of this record have been created using Scientist, clinical (histocompatibility and immunogenetics). Dictation errors have been sought, but may not have been identified and corrected.  Reinaldo Berber MD

## 2022-07-28 NOTE — Anesthesia Preprocedure Evaluation (Signed)
Anesthesia Evaluation  Patient identified by MRN, date of birth, ID band Patient awake    Reviewed: Allergy & Precautions, NPO status , Patient's Chart, lab work & pertinent test results  History of Anesthesia Complications Negative for: history of anesthetic complications  Airway Mallampati: II  TM Distance: >3 FB Neck ROM: Full    Dental no notable dental hx. (+) Teeth Intact   Pulmonary neg pulmonary ROS, neg sleep apnea, neg COPD, Patient abstained from smoking.Not current smoker   Pulmonary exam normal breath sounds clear to auscultation       Cardiovascular Exercise Tolerance: Good METS(-) hypertension(-) CAD and (-) Past MI negative cardio ROS (-) dysrhythmias  Rhythm:Regular Rate:Normal - Systolic murmurs    Neuro/Psych negative neurological ROS  negative psych ROS   GI/Hepatic ,neg GERD  ,,(+)     (-) substance abuse    Endo/Other  neg diabetes    Renal/GU negative Renal ROS     Musculoskeletal  (+) Arthritis ,    Abdominal   Peds  Hematology   Anesthesia Other Findings Past Medical History: No date: Allergy No date: Heart murmur  Reproductive/Obstetrics                             Anesthesia Physical Anesthesia Plan  ASA: 1  Anesthesia Plan: Spinal   Post-op Pain Management:    Induction: Intravenous  PONV Risk Score and Plan: 2 and Ondansetron, Dexamethasone, Propofol infusion, TIVA and Midazolam  Airway Management Planned: Natural Airway  Additional Equipment: None  Intra-op Plan:   Post-operative Plan:   Informed Consent: I have reviewed the patients History and Physical, chart, labs and discussed the procedure including the risks, benefits and alternatives for the proposed anesthesia with the patient or authorized representative who has indicated his/her understanding and acceptance.       Plan Discussed with: CRNA and Surgeon  Anesthesia Plan  Comments: (Discussed R/B/A of neuraxial anesthesia technique with patient: - rare risks of spinal/epidural hematoma, nerve damage, infection - Risk of PDPH - Risk of nausea and vomiting - Risk of conversion to general anesthesia and its associated risks, including sore throat, damage to lips/eyes/teeth/oropharynx, and rare risks such as cardiac and respiratory events. - Risk of allergic reactions  Discussed the role of CRNA in patient's perioperative care.  Patient voiced understanding.)       Anesthesia Quick Evaluation

## 2022-07-28 NOTE — Evaluation (Signed)
Physical Therapy Evaluation Patient Details Name: Mary West MRN: [id number] DOB: [birth year] Today's Date: 07/28/2022  History of Present Illness  Pt is a 67 yo F diagnosed with right hip oteoarthritis and is s/p elective R THA.  PMH incudes heart murmur.  Clinical Impression  Pt was pleasant and motivated to participate during the session and put forth good effort throughout. Pt required verbal cues to ensure compliance with anterior hip precautions during functional mobility assessment/training but required no physical assistance.  Pt was able to stand and take several steps near the EOB and then from bed to chair with no adverse symptoms, no overt LOB, and with SpO2 and HR WNL on room air.  Pt will benefit from continued PT services upon discharge to safely address deficits listed in patient problem list for decreased caregiver assistance and eventual return to PLOF.         Recommendations for follow up therapy are one component of a multi-disciplinary discharge planning process, led by the attending physician.  Recommendations may be updated based on patient status, additional functional criteria and insurance authorization.  Follow Up Recommendations       Assistance Recommended at Discharge Intermittent Supervision/Assistance  Patient can return home with the following  A little help with walking and/or transfers;A little help with bathing/dressing/bathroom;Assistance with cooking/housework;Help with stairs or ramp for entrance;Assist for transportation    Equipment Recommendations Rolling walker (2 wheels)  Recommendations for Other Services       Functional Status Assessment Patient has had a recent decline in their functional status and demonstrates the ability to make significant improvements in function in a reasonable and predictable amount of time.     Precautions / Restrictions Precautions Precautions: Anterior Hip Precaution Booklet Issued: Yes  (comment) Restrictions Weight Bearing Restrictions: Yes RLE Weight Bearing: Weight bearing as tolerated Other Position/Activity Restrictions: Per Dr. Audelia Acton ok for reciprocal gait pattern (do not need to do step-to pattern to avoid RLE hip ext, just avoid hyperextension)      Mobility  Bed Mobility Overal bed mobility: Needs Assistance Bed Mobility: Supine to Sit     Supine to sit: Supervision     General bed mobility comments: Min to mod verbal cues for sequencing for anterior hip precaution compliance, use of bed rail    Transfers Overall transfer level: Needs assistance Equipment used: Rolling walker (2 wheels) Transfers: Sit to/from Stand Sit to Stand: Min guard           General transfer comment: Min verbal cues for hand placement    Ambulation/Gait Ambulation/Gait assistance: Min guard Gait Distance (Feet): 8 Feet Assistive device: Rolling walker (2 wheels) Gait Pattern/deviations: Step-to pattern, Step-through pattern, Decreased step length - right, Decreased step length - left, Antalgic Gait velocity: decreased     General Gait Details: Min to mod verbal cues for sequencing for anterior hip precaution compliance during sharp turns to avoid closed kinetic chain R hip ER  Stairs            Wheelchair Mobility    Modified Rankin (Stroke Patients Only)       Balance Overall balance assessment: Needs assistance   Sitting balance-Leahy Scale: Normal     Standing balance support: Bilateral upper extremity supported, During functional activity, Reliant on assistive device for balance Standing balance-Leahy Scale: Fair                               Pertinent  Vitals/Pain Pain Assessment Pain Assessment: 0-10 Pain Score: 3  Pain Location: R hip Pain Descriptors / Indicators: Sore Pain Intervention(s): Repositioned, Premedicated before session, Monitored during session    Home Living Family/patient expects to be discharged to::  Private residence Living Arrangements: Non-relatives/Friends Available Help at Discharge: Friend(s);Available 24 hours/day Type of Home: House Home Access: Stairs to enter Entrance Stairs-Rails: Right;Left;Can reach both Entrance Stairs-Number of Steps: 4   Home Layout: One level Home Equipment: Grab bars - tub/shower      Prior Function Prior Level of Function : Independent/Modified Independent             Mobility Comments: Ind amb community distances without an AD, no fall history ADLs Comments: Ind with ADLs     Hand Dominance        Extremity/Trunk Assessment   Upper Extremity Assessment Upper Extremity Assessment: Overall WFL for tasks assessed    Lower Extremity Assessment Lower Extremity Assessment: Generalized weakness;RLE deficits/detail RLE Deficits / Details: BLE ankle AROM, strength, and sensation to light touch grossly WNL RLE: Unable to fully assess due to pain RLE Sensation: decreased light touch (sensation to light touch present but slightly decreased compared to LLE per pt) RLE Coordination: WNL       Communication   Communication: No difficulties  Cognition Arousal/Alertness: Awake/alert Behavior During Therapy: WFL for tasks assessed/performed Overall Cognitive Status: Within Functional Limits for tasks assessed                                          General Comments      Exercises Total Joint Exercises Ankle Circles/Pumps: AROM, Both, 10 reps Long Arc Quad: AROM, Strengthening, Both, 10 reps Knee Flexion: AROM, Strengthening, Both, 10 reps Marching in Standing: AROM, Strengthening, Both, 10 reps, Standing Other Exercises Other Exercises: HEP education per handout Other Exercises: Anterior hip precaution education verbally and during functional tasks   Assessment/Plan    PT Assessment Patient needs continued PT services  PT Problem List Decreased strength;Decreased activity tolerance;Decreased balance;Decreased  mobility;Decreased knowledge of use of DME;Decreased knowledge of precautions;Pain       PT Treatment Interventions DME instruction;Gait training;Stair training;Functional mobility training;Therapeutic activities;Therapeutic exercise;Balance training;Patient/family education    PT Goals (Current goals can be found in the Care Plan section)  Acute Rehab PT Goals Patient Stated Goal: To be able to walk without pain and work in the yard and house PT Goal Formulation: With patient Time For Goal Achievement: 08/10/22 Potential to Achieve Goals: Good    Frequency BID     Co-evaluation               AM-PAC PT "6 Clicks" Mobility  Outcome Measure Help needed turning from your back to your side while in a flat bed without using bedrails?: A Little Help needed moving from lying on your back to sitting on the side of a flat bed without using bedrails?: A Little Help needed moving to and from a bed to a chair (including a wheelchair)?: A Little Help needed standing up from a chair using your arms (e.g., wheelchair or bedside chair)?: A Little Help needed to walk in hospital room?: A Little Help needed climbing 3-5 steps with a railing? : A Lot 6 Click Score: 17    End of Session Equipment Utilized During Treatment: Gait belt Activity Tolerance: Patient tolerated treatment well Patient left: in chair;with call bell/phone  within reach;with family/visitor present Nurse Communication: Mobility status;Precautions;Weight bearing status PT Visit Diagnosis: Other abnormalities of gait and mobility (R26.89);Muscle weakness (generalized) (M62.81);Pain Pain - Right/Left: Right Pain - part of body: Hip    Time: 1610-9604 PT Time Calculation (min) (ACUTE ONLY): 26 min   Charges:   PT Evaluation $PT Eval Moderate Complexity: 1 Mod PT Treatments $Therapeutic Activity: 8-22 mins      D. Elly Modena PT, DPT 07/28/22, 5:32 PM

## 2022-07-28 NOTE — Transfer of Care (Signed)
Immediate Anesthesia Transfer of Care Note  Patient: Mary West  Procedure(s) Performed: TOTAL HIP ARTHROPLASTY ANTERIOR APPROACH (Right: Hip)  Patient Location: PACU  Anesthesia Type:Spinal  Level of Consciousness: awake, alert , and oriented  Airway & Oxygen Therapy: Patient Spontanous Breathing  Post-op Assessment: Report given to RN and Post -op Vital signs reviewed and stable  Post vital signs: Reviewed and stable  Last Vitals:  Vitals Value Taken Time  BP 104/63 07/28/22 1310  Temp 99.1   Pulse 68 07/28/22 1311  Resp 17 07/28/22 1311  SpO2 98 % 07/28/22 1311  Vitals shown include unvalidated device data.  Last Pain:  Vitals:   07/28/22 0917  TempSrc: Oral  PainSc: 0-No pain      Patients Stated Pain Goal: 0 (07/28/22 0917)  Complications: No notable events documented.

## 2022-07-29 ENCOUNTER — Encounter: Payer: Self-pay | Admitting: Orthopedic Surgery

## 2022-07-29 DIAGNOSIS — M1611 Unilateral primary osteoarthritis, right hip: Secondary | ICD-10-CM | POA: Diagnosis not present

## 2022-07-29 DIAGNOSIS — M6281 Muscle weakness (generalized): Secondary | ICD-10-CM | POA: Diagnosis not present

## 2022-07-29 DIAGNOSIS — Z01818 Encounter for other preprocedural examination: Secondary | ICD-10-CM | POA: Diagnosis not present

## 2022-07-29 LAB — CBC
HCT: 33.8 % — ABNORMAL LOW (ref 36.0–46.0)
Hemoglobin: 11.4 g/dL — ABNORMAL LOW (ref 12.0–15.0)
MCH: 33.6 pg (ref 26.0–34.0)
MCHC: 33.7 g/dL (ref 30.0–36.0)
MCV: 99.7 fL (ref 80.0–100.0)
Platelets: 191 10*3/uL (ref 150–400)
RBC: 3.39 MIL/uL — ABNORMAL LOW (ref 3.87–5.11)
RDW: 11.7 % (ref 11.5–15.5)
WBC: 7.4 10*3/uL (ref 4.0–10.5)
nRBC: 0 % (ref 0.0–0.2)

## 2022-07-29 LAB — BASIC METABOLIC PANEL
Anion gap: 8 (ref 5–15)
BUN: 11 mg/dL (ref 8–23)
CO2: 21 mmol/L — ABNORMAL LOW (ref 22–32)
Calcium: 8 mg/dL — ABNORMAL LOW (ref 8.9–10.3)
Chloride: 109 mmol/L (ref 98–111)
Creatinine, Ser: 0.64 mg/dL (ref 0.44–1.00)
GFR, Estimated: >60 mL/min (ref >60)
Glucose, Bld: 104 mg/dL — ABNORMAL HIGH (ref 70–99)
Potassium: 4.3 mmol/L (ref 3.5–5.1)
Sodium: 138 mmol/L (ref 135–145)

## 2022-07-29 MED ORDER — TRAMADOL HCL 50 MG PO TABS: 50.0000 mg | ORAL_TABLET | Freq: Four times a day (QID) | ORAL | 0 refills | Status: DC | PRN

## 2022-07-29 MED ORDER — OXYCODONE HCL 5 MG PO TABS: 2.5000 mg | ORAL_TABLET | Freq: Four times a day (QID) | ORAL | 0 refills | Status: DC | PRN

## 2022-07-29 MED ORDER — ACETAMINOPHEN 500 MG PO TABS: 1000.0000 mg | ORAL_TABLET | Freq: Three times a day (TID) | ORAL | 0 refills | Status: DC

## 2022-07-29 MED ORDER — ENOXAPARIN SODIUM 40 MG/0.4ML IJ SOSY: 40.0000 mg | PREFILLED_SYRINGE | INTRAMUSCULAR | 0 refills | Status: DC

## 2022-07-29 MED ORDER — DOCUSATE SODIUM 100 MG PO CAPS
ORAL_CAPSULE | ORAL | Status: AC
  Filled 2022-07-29: qty 1

## 2022-07-29 MED ORDER — CELECOXIB 200 MG PO CAPS: 200.0000 mg | ORAL_CAPSULE | Freq: Two times a day (BID) | ORAL | 0 refills | Status: AC

## 2022-07-29 MED ORDER — ACETAMINOPHEN 500 MG PO TABS
ORAL_TABLET | ORAL | Status: AC
  Filled 2022-07-29: qty 2

## 2022-07-29 MED ORDER — ONDANSETRON HCL 4 MG PO TABS: 4.0000 mg | ORAL_TABLET | Freq: Four times a day (QID) | ORAL | 0 refills | Status: DC | PRN

## 2022-07-29 MED ORDER — ENOXAPARIN SODIUM 40 MG/0.4ML IJ SOSY
PREFILLED_SYRINGE | INTRAMUSCULAR | Status: AC
  Filled 2022-07-29: qty 0.4

## 2022-07-29 MED ORDER — PANTOPRAZOLE SODIUM 40 MG PO TBEC
DELAYED_RELEASE_TABLET | ORAL | Status: AC
  Filled 2022-07-29: qty 1

## 2022-07-29 MED ORDER — KETOROLAC TROMETHAMINE 15 MG/ML IJ SOLN
INTRAMUSCULAR | Status: AC
  Filled 2022-07-29: qty 1

## 2022-07-29 MED ORDER — DOCUSATE SODIUM 100 MG PO CAPS: 100.0000 mg | ORAL_CAPSULE | Freq: Two times a day (BID) | ORAL | 0 refills | Status: DC

## 2022-07-29 NOTE — Plan of Care (Signed)
  Problem: Activity: Goal: Ability to avoid complications of mobility impairment will improve Outcome: Progressing   Problem: Pain Management: Goal: Pain level will decrease with appropriate interventions Outcome: Progressing   Problem: Skin Integrity: Goal: Will show signs of wound healing Outcome: Progressing   

## 2022-07-29 NOTE — Progress Notes (Signed)
Patient is not able to walk the distance required to go the bathroom, or she is unable to safely negotiate stairs required to access the bathroom.  A 3in1 BSC will alleviate this problem.       T. Chris Kayela Humphres, PA-C [facility] 

## 2022-07-29 NOTE — TOC Transition Note (Signed)
Transition of Care [facility]) - CM/SW Discharge Note   Patient Details  Name: Mary West MRN: [id number] Date of Birth: 17-Apr-1955  Transition of Care [facility] CM/SW Contact:  Garret Reddish, RN Phone Number: 07/29/2022, 12:26 PM   Clinical Narrative:  Chart reviewed.  Noted that patient has orders for discharge today.    Patient has been pre-arranged with [facility] for home care services.  Patient would like to  use [facility] services on discharge.  I have informed [place] with [facility] that patient will be discharge home for today.    PT has recommend a 2W rolling walker for the patient.  No BSC needed.    I have asked Barbara Cower with [employer] to provide patient a 2 wheeled rolling walker.  [employer] will deliver 2 wheeled rolling walker at bedside today.    I have informed staff nurse of the above information.       Final next level of care: Home w Home Health Services Barriers to Discharge: No Barriers Identified   Patient Goals and CMS Choice CMS Medicare.gov Compare Post Acute Care list provided to:: Patient Choice offered to / list presented to : Patient  Discharge Placement                      Patient and family notified of of transfer: 07/29/22  Discharge Plan and Services Additional resources added to the After Visit Summary for                  DME Arranged: Walker rolling DME Agency: [employer] Date DME Agency Contacted: 07/29/22 Time DME Agency Contacted: 1000 Representative spoke with at DME Agency: Barbara Cower HH Arranged: PT, OT HH Agency: [facility] Date [facility] Agency Contacted: 07/29/22 Time HH Agency Contacted: 1000 Representative spoke with at [facility] Agency: [place]  Social Determinants of Health (SDOH) Interventions SDOH Screenings   Food Insecurity: No Food Insecurity (07/28/2022)  Housing: Low Risk  (07/28/2022)  Transportation Needs: No Transportation Needs (07/28/2022)  Utilities: Not At Risk (07/28/2022)  Alcohol Screen: Low Risk   (07/14/2022)  Depression (PHQ2-9): Low Risk  (07/14/2022)  Tobacco Use: Low Risk  (07/29/2022)     Readmission Risk Interventions     No data to display

## 2022-07-29 NOTE — Discharge Summary (Signed)
Physician Discharge Summary  Patient ID: Mary West MRN: [id number] DOB/AGE: 02-Aug-1955 67 y.o.  Admit date: 07/28/2022 Discharge date: 07/29/2022  Admission Diagnoses:  Osteoarthritis of right hip [M16.11]   Discharge Diagnoses: Patient Active Problem List   Diagnosis Date Noted   Osteoarthritis of right hip 07/28/2022   Screening for colon cancer    Unspecified menopausal and perimenopausal disorder 10/17/2018   Routine cervical smear 10/17/2018   Heart murmur 09/30/2017   Seasonal allergies 09/30/2017    Past Medical History:  Diagnosis Date   Allergy    Heart murmur      Transfusion: none   Consultants (if any):   Discharged Condition: Improved  Hospital Course: Mary West is an 67 y.o. female who was admitted 07/28/2022 with a diagnosis of Osteoarthritis of right hip and went to the operating room on 07/28/2022 and underwent the above named procedures.    Surgeries: Procedure(s): TOTAL HIP ARTHROPLASTY ANTERIOR APPROACH on 07/28/2022 Patient tolerated the surgery well. Taken to PACU where she was stabilized and then transferred to the orthopedic floor.  Started on Lovenox 40 mg q 24 hrs. TEDs and SCDs applied bilaterally. Heels elevated on bed. No evidence of DVT. Negative Homan. Physical therapy started on day #1 for gait training and transfer. OT started day #1 for ADL and assisted devices.  Patient's IV  was d/c on day #1. Patient was able to safely and independently complete all PT goals. PT recommending discharge to home.    On post op day #1 patient was stable and ready for discharge to home with HHPT.  Implants: Cup: Trident Tritanium 83mm/E clusterhole w/ x2 screws    Liner: MDM 42/E  Stem: Insignia #4 high offset  Head:ADM/MDM 28/48 X3 poly with biolox 28 +13mm ball   She was given perioperative antibiotics:  Anti-infectives (From admission, onward)    Start     Dose/Rate Route Frequency Ordered Stop   07/28/22 1700  ceFAZolin (ANCEF)  IVPB 2g/100 mL premix        2 g 200 mL/hr over 30 Minutes Intravenous Every 6 hours 07/28/22 1328 07/28/22 2342   07/28/22 0600  ceFAZolin (ANCEF) IVPB 2g/100 mL premix        2 g 200 mL/hr over 30 Minutes Intravenous On call to O.R. 07/28/22 0125 07/28/22 1111     .  She was given sequential compression devices, early ambulation, and Lovenox TEDs for DVT prophylaxis.  She benefited maximally from the hospital stay and there were no complications.    Recent vital signs:  Vitals:   07/28/22 2309 07/29/22 0509  BP: 108/71 130/68  Pulse: 72 82  Resp: 18 18  Temp: 97.9 F (36.6 C) 97.8 F (36.6 C)  SpO2: 100% 99%    Recent laboratory studies:  Lab Results  Component Value Date   HGB 11.4 (L) 07/29/2022   HGB 13.5 07/16/2022   HGB 14.4 09/19/2021   Lab Results  Component Value Date   WBC 7.4 07/29/2022   PLT 191 07/29/2022   No results found for: "INR" Lab Results  Component Value Date   NA 138 07/29/2022   K 4.3 07/29/2022   CL 109 07/29/2022   CO2 21 (L) 07/29/2022   BUN 11 07/29/2022   CREATININE 0.64 07/29/2022   GLUCOSE 104 (H) 07/29/2022    Discharge Medications:   Allergies as of 07/29/2022       Reactions   Tape Rash   Red skin  Medication List     STOP taking these medications    acetaminophen 650 MG CR tablet Commonly known as: TYLENOL Replaced by: acetaminophen 500 MG tablet   meloxicam 7.5 MG tablet Commonly known as: MOBIC   nitrofurantoin (macrocrystal-monohydrate) 100 MG capsule Commonly known as: MACROBID       TAKE these medications    acetaminophen 500 MG tablet Commonly known as: TYLENOL Take 2 tablets (1,000 mg total) by mouth every 8 (eight) hours. Replaces: acetaminophen 650 MG CR tablet   calcium carbonate 500 MG chewable tablet Commonly known as: TUMS - dosed in mg elemental calcium Chew 1 tablet by mouth daily.   calcium carbonate 600 MG Tabs tablet Commonly known as: OS-CAL Take 600 mg by mouth  daily.   celecoxib 200 MG capsule Commonly known as: CeleBREX Take 1 capsule (200 mg total) by mouth 2 (two) times daily for 14 days.   docusate sodium 100 MG capsule Commonly known as: COLACE Take 1 capsule (100 mg total) by mouth 2 (two) times daily.   enoxaparin 40 MG/0.4ML injection Commonly known as: LOVENOX Inject 0.4 mLs (40 mg total) into the skin daily for 14 days.   famotidine 20 MG tablet Commonly known as: PEPCID Take 20 mg by mouth 2 (two) times daily.   ibandronate 150 MG tablet Commonly known as: Boniva Take in the morning with a full glass of water, on an empty stomach once a month,and do not take anything else by mouth or lie down for the next 30 min.   MULTIVITAMIN ADULTS PO Take by mouth daily. Centrum silver 50 vitamin C 135 mg zinc   ondansetron 4 MG tablet Commonly known as: ZOFRAN Take 1 tablet (4 mg total) by mouth every 6 (six) hours as needed for nausea.   OVER THE COUNTER MEDICATION Calcium 750 mg and vitamin D 500 IU and vitamin K 40 mcg   oxyCODONE 5 MG immediate release tablet Commonly known as: Roxicodone Take 0.5 tablets (2.5 mg total) by mouth every 6 (six) hours as needed for breakthrough pain.   traMADol 50 MG tablet Commonly known as: ULTRAM Take 1 tablet (50 mg total) by mouth every 6 (six) hours as needed for moderate pain.   TYLENOL SINUS CONGESTION/PAIN PO Take by mouth.        Diagnostic Studies: DG HIP UNILAT WITH PELVIS 1V RIGHT  Result Date: 07/28/2022 CLINICAL DATA:  Hip replacement EXAM: Intraoperative fluoroscopy COMPARISON:  None Available. FINDINGS: Three fluoroscopic spot images submitted for review from intraoperative fluoroscopy demonstrate placement of hip arthroplasty with Press-Fit femoral component and screw fixated acetabular cup. Imaging was obtained to aid in treatment. Please correlate with real-time fluoroscopy of 26.0 seconds and cumulative air kerma 1.9211 mGy IMPRESSION: Intraoperative fluoroscopy  Electronically Signed   By: Karen Kays M.D.   On: 07/28/2022 12:55   DG C-Arm 1-60 Min-No Report  Result Date: 07/28/2022 Fluoroscopy was utilized by the requesting physician.  No radiographic interpretation.   DG C-Arm 1-60 Min-No Report  Result Date: 07/28/2022 Fluoroscopy was utilized by the requesting physician.  No radiographic interpretation.   CT HIP RIGHT WO CONTRAST  Result Date: 07/04/2022 CLINICAL DATA:  Right hip pain PA EXAM: CT OF THE RIGHT HIP WITHOUT CONTRAST TECHNIQUE: Multidetector CT imaging of the right hip was performed according to the standard protocol. Multiplanar CT image reconstructions were also generated. RADIATION DOSE REDUCTION: This exam was performed according to the departmental dose-optimization program which includes automated exposure control, adjustment of the mA and/or  kV according to patient size and/or use of iterative reconstruction technique. COMPARISON:  None Available. FINDINGS: Severe/advanced degenerative changes involving the right hip. There is full-thickness cartilage loss, bone-on-bone contact, bony eburnation osteophytic spurring and significant subchondral cystic change. Also, the femoral head is largely uncovered both laterally and anteriorly. Hip demonstrates excessive The visualized right hemipelvic bony structures are intact. The pubic symphysis and right SI joint are intact. No obvious muscle tear or intramuscular hematoma. No significant right-sided intrapelvic abnormalities.  Anteversion. IMPRESSION: 1. Severe/advanced degenerative changes involving the right hip as detailed above. Hip is significantly uncovered both laterally and anteriorly with excessive anteversion. 2. No acute bony findings. Electronically Signed   By: Rudie Meyer M.D.   On: 07/04/2022 13:26    Disposition:      Follow-up Information     Evon Slack, PA-C Follow up in 2 week(s).   Specialties: Orthopedic Surgery, Emergency Medicine Contact information: [address] [place] [address] [phone]                  Signed: Patience Musca 07/29/2022, 7:41 AM

## 2022-07-29 NOTE — Discharge Instructions (Signed)
Instructions after Anterior Total Hip Replacement        Dr. Zachary Aberman, Jr., M.D.      Dept. of Orthopaedics & Sports Medicine  [facility]  [address]  [place], [place]  [address]  Phone: [phone]   Fax: [phone]    DIET: Drink plenty of non-alcoholic fluids. Resume your normal diet. Include foods high in fiber.  ACTIVITY:  You may use crutches or a walker with weight-bearing as tolerated, unless instructed otherwise. You may be weaned off of the walker or crutches by your Physical Therapist.  Continue doing gentle exercises. Exercising will reduce the pain and swelling, increase motion, and prevent muscle weakness.   Please continue to use the TED compression stockings for 2 weeks. You may remove the stockings at night, but should reapply them in the morning. Do not drive or operate any equipment until instructed.  WOUND CARE:  Continue to use ice packs periodically to reduce pain and swelling. You may shower with honeycomb dressing 3 days after your surgery. Do not submerge incision site under water. Remove honeycomb dressing 7 days after surgery and allow dermabond to fall off on its own.   MEDICATIONS: You may resume your regular medications. Please take the pain medication as prescribed on the medication list. Do not take pain medication on an empty stomach. You have been given a prescription for a blood thinner to prevent blood clots. Please take the medication as instructed. (NOTE: After completing a 2 week course of Lovenox, take one Enteric-coated 81 mg aspirin twice a day for 3 additional weeks.) Pain medications and iron supplements can cause constipation. Use a stool softener (Senokot or Colace) on a daily basis and a laxative (dulcolax or miralax) as needed. Do not drive or drink alcoholic beverages when taking pain medications.  POSTOPERATIVE CONSTIPATION PROTOCOL Constipation - defined medically as fewer than three stools per week and  severe constipation as less than one stool per week.  One of the most common issues patients have following surgery is constipation.  Even if you have a regular bowel pattern at home, your normal regimen is likely to be disrupted due to multiple reasons following surgery.  Combination of anesthesia, postoperative narcotics, change in appetite and fluid intake all can affect your bowels.  In order to avoid complications following surgery, here are some recommendations in order to help you during your recovery period.  Colace (docusate) - Pick up an over-the-counter form of Colace or another stool softener and take twice a day as long as you are requiring postoperative pain medications.  Take with a full glass of water daily.  If you experience loose stools or diarrhea, hold the colace until you stool forms back up.  If your symptoms do not get better within 1 week or if they get worse, check with your doctor.  Dulcolax (bisacodyl) - Pick up over-the-counter and take as directed by the product packaging as needed to assist with the movement of your bowels.  Take with a full glass of water.  Use this product as needed if not relieved by Colace only.   MiraLax (polyethylene glycol) - Pick up over-the-counter to have on hand.  MiraLax is a solution that will increase the amount of water in your bowels to assist with bowel movements.  Take as directed and can mix with a glass of water, juice, soda, coffee, or tea.  Take if you go more than two days without a movement. Do not use MiraLax more than   once per day. Call your doctor if you are still constipated or irregular after using this medication for 7 days in a row.  If you continue to have problems with postoperative constipation, please contact the office for further assistance and recommendations.  If you experience "the worst abdominal pain ever" or develop nausea or vomiting, please contact the office immediatly for further recommendations for  treatment.   CALL THE OFFICE FOR: Temperature above 101 degrees Excessive bleeding or drainage on the dressing. Excessive swelling, coldness, or paleness of the toes. Persistent nausea and vomiting.  FOLLOW-UP:  You should have an appointment to return to the office in 2 weeks after surgery. Arrangements have been made for continuation of Physical Therapy (either home therapy or outpatient therapy).  

## 2022-07-29 NOTE — Evaluation (Signed)
Occupational Therapy Evaluation Patient Details Name: Natalea Krengel MRN: [id number] DOB: Jun 16, 1955 Today's Date: 07/29/2022   History of Present Illness Pt is a 67 yo F diagnosed with right hip oteoarthritis and is s/p elective R THA.  PMH incudes heart murmur.   Clinical Impression   Ms. Hagadorn was seen for OT evaluation this date. Prior to hospital admission, pt was independent and active in the community. Pt lives in a one story house with 3-4 steps to enter. Pt reports having accessible bathroom with raised toilet and grab bars.. Pt presents to acute OT demonstrating impaired ADL performance and functional mobility 2/2 decreased ROM and activity tolerance (See OT problem list for additional functional deficits). Pt currently requires MIN A for lower body dressing. Pt and friend educated on hip precautions, dressing techniques, safe transfers, TED hose management. Pt demonstrated good effort. No concerns at this time, OT will sign off.      Recommendations for follow up therapy are one component of a multi-disciplinary discharge planning process, led by the attending physician.  Recommendations may be updated based on patient status, additional functional criteria and insurance authorization.   Assistance Recommended at Discharge Set up Supervision/Assistance  Patient can return home with the following A little help with bathing/dressing/bathroom;Assistance with cooking/housework;Assist for transportation;Help with stairs or ramp for entrance    Functional Status Assessment  Patient has had a recent decline in their functional status and demonstrates the ability to make significant improvements in function in a reasonable and predictable amount of time.  Equipment Recommendations  Other (comment) (Pt reports having access to all DME suggested)    Recommendations for Other Services       Precautions / Restrictions Precautions Precautions: Anterior Hip Restrictions Weight  Bearing Restrictions: Yes RLE Weight Bearing: Weight bearing as tolerated Other Position/Activity Restrictions: Per Dr. Audelia Acton ok for reciprocal gait pattern (do not need to do step-to pattern to avoid RLE hip ext, just avoid hyperextension)      Mobility Bed Mobility                    Transfers Overall transfer level: Needs assistance Equipment used: Rolling walker (2 wheels) Transfers: Sit to/from Stand Sit to Stand: Min guard                  Balance Overall balance assessment: Needs assistance Sitting-balance support: Feet supported, No upper extremity supported Sitting balance-Leahy Scale: Normal     Standing balance support: Single extremity supported Standing balance-Leahy Scale: Good Standing balance comment: Able to pull up pants while holding onto walker                           ADL either performed or assessed with clinical judgement   ADL Overall ADL's : Needs assistance/impaired                     Lower Body Dressing: Minimal assistance Lower Body Dressing Details (indicate cue type and reason): MIN A thread clothing over feet and to don/doff socks/shoes             Functional mobility during ADLs: Supervision/safety General ADL Comments: Pt anticipated to require supervision - MIN A to complete ADLs upon arriving home to maintain anterior hip precautions.     Vision         Perception     Praxis      Pertinent Vitals/Pain Pain Assessment Pain Assessment: 0-10  Pain Score: 2  Pain Location: R hip Pain Descriptors / Indicators: Sore     Hand Dominance     Extremity/Trunk Assessment Upper Extremity Assessment Upper Extremity Assessment: Overall WFL for tasks assessed   Lower Extremity Assessment Lower Extremity Assessment: Overall WFL for tasks assessed       Communication Communication Communication: No difficulties   Cognition Arousal/Alertness: Awake/alert Behavior During Therapy: WFL for  tasks assessed/performed Overall Cognitive Status: Within Functional Limits for tasks assessed                                       General Comments       Exercises     Shoulder Instructions      Home Living Family/patient expects to be discharged to:: Private residence Living Arrangements: Non-relatives/Friends Available Help at Discharge: Friend(s);Available 24 hours/day Type of Home: House Home Access: Stairs to enter [employer] of Steps: 4 Entrance Stairs-Rails: Right;Left;Can reach both Home Layout: One level     Bathroom Shower/Tub: Chief Strategy Officer: Handicapped height Bathroom Accessibility: Yes How Accessible: Accessible via walker Home Equipment: Grab bars - tub/shower   Additional Comments: Pt and friend report feeling safe returning home      Prior Functioning/Environment Prior Level of Function : Independent/Modified Independent             Mobility Comments: Ind amb community distances without an AD, no fall history ADLs Comments: Ind with ADLs        OT Problem List: Decreased range of motion;Decreased activity tolerance      OT Treatment/Interventions:      OT Goals(Current goals can be found in the care plan section) Acute Rehab OT Goals Patient Stated Goal: To go home OT Goal Formulation: With patient Time For Goal Achievement: 08/12/22 Potential to Achieve Goals: Good  OT Frequency:      Co-evaluation              AM-PAC OT "6 Clicks" Daily Activity     Outcome Measure Help from another person eating meals?: None Help from another person taking care of personal grooming?: None Help from another person toileting, which includes using toliet, bedpan, or urinal?: None Help from another person bathing (including washing, rinsing, drying)?: A Little Help from another person to put on and taking off regular upper body clothing?: None Help from another person to put on and taking off  regular lower body clothing?: A Little 6 Click Score: 22   End of Session Equipment Utilized During Treatment: Rolling walker (2 wheels)  Activity Tolerance: Patient tolerated treatment well Patient left: in chair  OT Visit Diagnosis: Other abnormalities of gait and mobility (R26.89)                Time: 4098-1191 OT Time Calculation (min): 21 min Charges:  OT General Charges $OT Visit: 1 Visit OT Evaluation $OT Eval Low Complexity: 1 Low  [employer], OTS

## 2022-07-29 NOTE — Progress Notes (Signed)
Physical Therapy Treatment Patient Details Name: Mary West MRN: [id number] DOB: [birth year] Today's Date: 07/29/2022   History of Present Illness Pt is a 67 yo F diagnosed with right hip oteoarthritis and is s/p elective R THA.  PMH incudes heart murmur.    PT Comments    Pt was pleasant and motivated to participate during the session and put forth good effort throughout. Pt required minimal assistance with BLE control during sit to sup but required no physical assistance with any other functional tasks.  Pt demonstrated good control and stability with transfers, gait, and stair training as well as good carryover of proper sequencing during functional mobility for hip precaution compliance.  Pt reported no adverse symptoms during the session with SpO2 and HR WNL on room air.  Pt will benefit from continued PT services upon discharge to safely address deficits listed in patient problem list for decreased caregiver assistance and eventual return to PLOF.     Recommendations for follow up therapy are one component of a multi-disciplinary discharge planning process, led by the attending physician.  Recommendations may be updated based on patient status, additional functional criteria and insurance authorization.  Follow Up Recommendations       Assistance Recommended at Discharge Intermittent Supervision/Assistance  Patient can return home with the following A little help with walking and/or transfers;A little help with bathing/dressing/bathroom;Assistance with cooking/housework;Help with stairs or ramp for entrance;Assist for transportation   Equipment Recommendations  Rolling walker (2 wheels)    Recommendations for Other Services       Precautions / Restrictions Precautions Precautions: Anterior Hip Precaution Booklet Issued: Yes (comment) Restrictions Weight Bearing Restrictions: Yes RLE Weight Bearing: Weight bearing as tolerated Other Position/Activity Restrictions: Per  Dr. Audelia Acton ok for reciprocal gait pattern (do not need to do step-to pattern to avoid RLE hip ext, just avoid hyperextension)     Mobility  Bed Mobility   Bed Mobility: Supine to Sit, Sit to Supine     Supine to sit: Supervision Sit to supine: Min assist   General bed mobility comments: Min A for BLE control during sit to sup with cues for sequencing for ant hip precaution compliance    Transfers Overall transfer level: Needs assistance Equipment used: Rolling walker (2 wheels) Transfers: Sit to/from Stand Sit to Stand: Supervision           General transfer comment: Good control and stability from various surfaces    Ambulation/Gait Ambulation/Gait assistance: Supervision Gait Distance (Feet): 100 Feet x 2 Assistive device: Rolling walker (2 wheels) Gait Pattern/deviations: Step-through pattern, Decreased step length - right, Decreased step length - left Gait velocity: decreased     General Gait Details: Slow cadence but steady with no overt LOB and with min verbal cues for sequencing to prevent CKC R hip ER   Stairs Stairs: Yes Stairs assistance: Supervision Stair Management: Two rails, Step to pattern, Forwards Number of Stairs: 4 General stair comments: Min verbal and visual cues for proper sequencing with pt able to demonstrate good carryover with good eccentric and concentric control and stability   Wheelchair Mobility    Modified Rankin (Stroke Patients Only)       Balance Overall balance assessment: Needs assistance Sitting-balance support: Feet supported, No upper extremity supported Sitting balance-Leahy Scale: Normal     Standing balance support: Bilateral upper extremity supported, During functional activity Standing balance-Leahy Scale: Good  Cognition Arousal/Alertness: Awake/alert Behavior During Therapy: WFL for tasks assessed/performed Overall Cognitive Status: Within Functional Limits for  tasks assessed                                          Exercises Other Exercises Other Exercises: Car transfer sequencing education and review Other Exercises: Anterior hip precaution education verbally and during functional tasks    General Comments        Pertinent Vitals/Pain Pain Assessment Pain Assessment: No/denies pain    Home Living Family/patient expects to be discharged to:: Private residence Living Arrangements: Non-relatives/Friends Available Help at Discharge: Friend(s);Available 24 hours/day Type of Home: House Home Access: Stairs to enter Entrance Stairs-Rails: Right;Left;Can reach both Entrance Stairs-Number of Steps: 4   Home Layout: One level Home Equipment: Grab bars - tub/shower Additional Comments: Pt and friend report feeling safe returning home    Prior Function            PT Goals (current goals can now be found in the care plan section) Progress towards PT goals: Progressing toward goals    Frequency    BID      PT Plan Current plan remains appropriate    Co-evaluation              AM-PAC PT "6 Clicks" Mobility   Outcome Measure  Help needed turning from your back to your side while in a flat bed without using bedrails?: A Little Help needed moving from lying on your back to sitting on the side of a flat bed without using bedrails?: A Little Help needed moving to and from a bed to a chair (including a wheelchair)?: A Little Help needed standing up from a chair using your arms (e.g., wheelchair or bedside chair)?: A Little Help needed to walk in hospital room?: A Little Help needed climbing 3-5 steps with a railing? : A Little 6 Click Score: 18    End of Session Equipment Utilized During Treatment: Gait belt Activity Tolerance: Patient tolerated treatment well Patient left: in chair;with call bell/phone within reach;with family/visitor present Nurse Communication: Mobility status;Precautions;Weight bearing  status PT Visit Diagnosis: Other abnormalities of gait and mobility (R26.89);Muscle weakness (generalized) (M62.81);Pain Pain - Right/Left: Right Pain - part of body: Hip     Time: 1011-1036 PT Time Calculation (min) (ACUTE ONLY): 25 min  Charges:  $Gait Training: 8-22 mins $Therapeutic Activity: 8-22 mins                     D. Scott Carleigh Buccieri PT, DPT 07/29/22, 11:24 AM

## 2022-07-29 NOTE — Progress Notes (Signed)
DISCHARGE NOTE:  Pt given discharge instructions and verbalized understanding. Walker sent with pt, pt wheeled to car by staff, husband providing transportation.

## 2022-07-29 NOTE — Progress Notes (Addendum)
   Subjective: 1 Day Post-Op Procedure(s) (LRB): TOTAL HIP ARTHROPLASTY ANTERIOR APPROACH (Right) Patient reports pain as mild.   Patient is well, and has had no acute complaints or problems Denies any CP, SOB, ABD pain. We will continue therapy today.  Plan is to go Home after hospital stay.  Objective: Vital signs in last 24 hours: Temp:  [97.8 F (36.6 C)-99.1 F (37.3 C)] 97.8 F (36.6 C) (06/04 0509) Pulse Rate:  [55-82] 82 (06/04 0509) Resp:  [12-28] 18 (06/04 0509) BP: (86-130)/(50-91) 130/68 (06/04 0509) SpO2:  [97 %-100 %] 99 % (06/04 0509) Weight:  [64.9 kg] 64.9 kg (06/03 0917)  Intake/Output from previous day: 06/03 0701 - 06/04 0700 In: 2679.7 [I.V.:2379.7; IV Piggyback:300] Out: 550 [Urine:400; Blood:150] Intake/Output this shift: No intake/output data recorded.  Recent Labs    07/29/22 0547  HGB 11.4*   Recent Labs    07/29/22 0547  WBC 7.4  RBC 3.39*  HCT 33.8*  PLT 191   Recent Labs    07/29/22 0547  NA 138  K 4.3  CL 109  CO2 21*  BUN 11  CREATININE 0.64  GLUCOSE 104*  CALCIUM 8.0*   No results for input(s): "LABPT", "INR" in the last 72 hours.  EXAM General - Patient is Alert, Appropriate, and Oriented Extremity - Neurovascular intact Sensation intact distally Intact pulses distally Dorsiflexion/Plantar flexion intact No cellulitis present Compartment soft Dressing - dressing C/D/I and no drainage Motor Function - intact, moving foot and toes well on exam.   Past Medical History:  Diagnosis Date   Allergy    Heart murmur     Assessment/Plan:   1 Day Post-Op Procedure(s) (LRB): TOTAL HIP ARTHROPLASTY ANTERIOR APPROACH (Right) Principal Problem:   Osteoarthritis of right hip  Estimated body mass index is 23.45 kg/m as calculated from the following:   Height as of this encounter: 5' 5.5" (1.664 m).   Weight as of this encounter: 64.9 kg. Advance diet Up with therapy Pain well controlled Labs and VSS CM to assist  with discharge to home with HHPT today  DVT Prophylaxis - Lovenox, TED hose, and SCDs Weight-Bearing as tolerated to right leg   T. Cranston Neighbor, PA-C [facility] 07/29/2022, 7:32 AM   Patient seen and examined, agree with above plan.  The patient is doing well status post right anterior total hip arthroplasty, no concerns at this time.  Pain is controlled.  Discussed DVT prophylaxis, pain medication use, and safe transition to home.  All questions answered the patient agrees with above plan will go home after clears PT.   Reinaldo Berber MD

## 2022-07-29 NOTE — Anesthesia Postprocedure Evaluation (Signed)
Anesthesia Post Note  Patient: Mary West  Procedure(s) Performed: TOTAL HIP ARTHROPLASTY ANTERIOR APPROACH (Right: Hip)  Patient location during evaluation: Short Stay Anesthesia Type: Spinal Level of consciousness: oriented and awake and alert Pain management: pain level controlled Vital Signs Assessment: post-procedure vital signs reviewed and stable Respiratory status: spontaneous breathing and respiratory function stable Cardiovascular status: blood pressure returned to baseline and stable Postop Assessment: no headache, no backache, no apparent nausea or vomiting, patient able to bend at knees and able to ambulate Anesthetic complications: no   No notable events documented.   Last Vitals:  Vitals:   07/28/22 2309 07/29/22 0509  BP: 108/71 130/68  Pulse: 72 82  Resp: 18 18  Temp: 36.6 C 36.6 C  SpO2: 100% 99%    Last Pain:  Vitals:   07/29/22 0509  TempSrc: Temporal  PainSc: 0-No pain                 Lynden Oxford

## 2022-07-29 NOTE — Progress Notes (Signed)
Pts BP 90/56, PA Chris notified, per [place] pt ok to discharge, but to encourage po fluids. Pt notified and verbalized understanding.

## 2022-07-30 DIAGNOSIS — N959 Unspecified menopausal and perimenopausal disorder: Secondary | ICD-10-CM | POA: Diagnosis not present

## 2022-07-30 DIAGNOSIS — Z9181 History of falling: Secondary | ICD-10-CM | POA: Diagnosis not present

## 2022-07-30 DIAGNOSIS — Z471 Aftercare following joint replacement surgery: Secondary | ICD-10-CM | POA: Diagnosis not present

## 2022-07-30 DIAGNOSIS — M1990 Unspecified osteoarthritis, unspecified site: Secondary | ICD-10-CM | POA: Diagnosis not present

## 2022-07-30 DIAGNOSIS — Z96641 Presence of right artificial hip joint: Secondary | ICD-10-CM | POA: Diagnosis not present

## 2022-07-30 DIAGNOSIS — K5900 Constipation, unspecified: Secondary | ICD-10-CM | POA: Diagnosis not present

## 2022-07-30 DIAGNOSIS — J302 Other seasonal allergic rhinitis: Secondary | ICD-10-CM | POA: Diagnosis not present

## 2022-07-30 DIAGNOSIS — Z791 Long term (current) use of non-steroidal anti-inflammatories (NSAID): Secondary | ICD-10-CM | POA: Diagnosis not present

## 2022-07-30 DIAGNOSIS — Z7982 Long term (current) use of aspirin: Secondary | ICD-10-CM | POA: Diagnosis not present

## 2022-08-01 DIAGNOSIS — Z96641 Presence of right artificial hip joint: Secondary | ICD-10-CM | POA: Diagnosis not present

## 2022-08-01 DIAGNOSIS — Z791 Long term (current) use of non-steroidal anti-inflammatories (NSAID): Secondary | ICD-10-CM | POA: Diagnosis not present

## 2022-08-01 DIAGNOSIS — Z9181 History of falling: Secondary | ICD-10-CM | POA: Diagnosis not present

## 2022-08-01 DIAGNOSIS — K5900 Constipation, unspecified: Secondary | ICD-10-CM | POA: Diagnosis not present

## 2022-08-01 DIAGNOSIS — N959 Unspecified menopausal and perimenopausal disorder: Secondary | ICD-10-CM | POA: Diagnosis not present

## 2022-08-01 DIAGNOSIS — J302 Other seasonal allergic rhinitis: Secondary | ICD-10-CM | POA: Diagnosis not present

## 2022-08-01 DIAGNOSIS — Z7982 Long term (current) use of aspirin: Secondary | ICD-10-CM | POA: Diagnosis not present

## 2022-08-01 DIAGNOSIS — Z471 Aftercare following joint replacement surgery: Secondary | ICD-10-CM | POA: Diagnosis not present

## 2022-08-01 DIAGNOSIS — M1990 Unspecified osteoarthritis, unspecified site: Secondary | ICD-10-CM | POA: Diagnosis not present

## 2022-08-01 LAB — SURGICAL PATHOLOGY

## 2022-08-04 DIAGNOSIS — Z471 Aftercare following joint replacement surgery: Secondary | ICD-10-CM | POA: Diagnosis not present

## 2022-08-04 DIAGNOSIS — Z791 Long term (current) use of non-steroidal anti-inflammatories (NSAID): Secondary | ICD-10-CM | POA: Diagnosis not present

## 2022-08-04 DIAGNOSIS — Z7982 Long term (current) use of aspirin: Secondary | ICD-10-CM | POA: Diagnosis not present

## 2022-08-04 DIAGNOSIS — Z9181 History of falling: Secondary | ICD-10-CM | POA: Diagnosis not present

## 2022-08-04 DIAGNOSIS — M1990 Unspecified osteoarthritis, unspecified site: Secondary | ICD-10-CM | POA: Diagnosis not present

## 2022-08-04 DIAGNOSIS — N959 Unspecified menopausal and perimenopausal disorder: Secondary | ICD-10-CM | POA: Diagnosis not present

## 2022-08-04 DIAGNOSIS — J302 Other seasonal allergic rhinitis: Secondary | ICD-10-CM | POA: Diagnosis not present

## 2022-08-04 DIAGNOSIS — Z96641 Presence of right artificial hip joint: Secondary | ICD-10-CM | POA: Diagnosis not present

## 2022-08-04 DIAGNOSIS — K5900 Constipation, unspecified: Secondary | ICD-10-CM | POA: Diagnosis not present

## 2022-08-07 DIAGNOSIS — N959 Unspecified menopausal and perimenopausal disorder: Secondary | ICD-10-CM | POA: Diagnosis not present

## 2022-08-07 DIAGNOSIS — K5900 Constipation, unspecified: Secondary | ICD-10-CM | POA: Diagnosis not present

## 2022-08-07 DIAGNOSIS — Z96641 Presence of right artificial hip joint: Secondary | ICD-10-CM | POA: Diagnosis not present

## 2022-08-07 DIAGNOSIS — Z471 Aftercare following joint replacement surgery: Secondary | ICD-10-CM | POA: Diagnosis not present

## 2022-08-07 DIAGNOSIS — Z7982 Long term (current) use of aspirin: Secondary | ICD-10-CM | POA: Diagnosis not present

## 2022-08-07 DIAGNOSIS — M1990 Unspecified osteoarthritis, unspecified site: Secondary | ICD-10-CM | POA: Diagnosis not present

## 2022-08-07 DIAGNOSIS — Z791 Long term (current) use of non-steroidal anti-inflammatories (NSAID): Secondary | ICD-10-CM | POA: Diagnosis not present

## 2022-08-07 DIAGNOSIS — Z9181 History of falling: Secondary | ICD-10-CM | POA: Diagnosis not present

## 2022-08-07 DIAGNOSIS — J302 Other seasonal allergic rhinitis: Secondary | ICD-10-CM | POA: Diagnosis not present

## 2022-08-12 DIAGNOSIS — J302 Other seasonal allergic rhinitis: Secondary | ICD-10-CM | POA: Diagnosis not present

## 2022-08-12 DIAGNOSIS — K5900 Constipation, unspecified: Secondary | ICD-10-CM | POA: Diagnosis not present

## 2022-08-12 DIAGNOSIS — Z471 Aftercare following joint replacement surgery: Secondary | ICD-10-CM | POA: Diagnosis not present

## 2022-08-12 DIAGNOSIS — Z791 Long term (current) use of non-steroidal anti-inflammatories (NSAID): Secondary | ICD-10-CM | POA: Diagnosis not present

## 2022-08-12 DIAGNOSIS — M1990 Unspecified osteoarthritis, unspecified site: Secondary | ICD-10-CM | POA: Diagnosis not present

## 2022-08-12 DIAGNOSIS — N959 Unspecified menopausal and perimenopausal disorder: Secondary | ICD-10-CM | POA: Diagnosis not present

## 2022-08-12 DIAGNOSIS — Z9181 History of falling: Secondary | ICD-10-CM | POA: Diagnosis not present

## 2022-08-12 DIAGNOSIS — Z7982 Long term (current) use of aspirin: Secondary | ICD-10-CM | POA: Diagnosis not present

## 2022-08-12 DIAGNOSIS — Z96641 Presence of right artificial hip joint: Secondary | ICD-10-CM | POA: Diagnosis not present

## 2022-08-15 DIAGNOSIS — Z471 Aftercare following joint replacement surgery: Secondary | ICD-10-CM | POA: Diagnosis not present

## 2022-08-19 DIAGNOSIS — M1990 Unspecified osteoarthritis, unspecified site: Secondary | ICD-10-CM | POA: Diagnosis not present

## 2022-08-19 DIAGNOSIS — Z791 Long term (current) use of non-steroidal anti-inflammatories (NSAID): Secondary | ICD-10-CM | POA: Diagnosis not present

## 2022-08-19 DIAGNOSIS — Z7982 Long term (current) use of aspirin: Secondary | ICD-10-CM | POA: Diagnosis not present

## 2022-08-19 DIAGNOSIS — Z471 Aftercare following joint replacement surgery: Secondary | ICD-10-CM | POA: Diagnosis not present

## 2022-08-19 DIAGNOSIS — N959 Unspecified menopausal and perimenopausal disorder: Secondary | ICD-10-CM | POA: Diagnosis not present

## 2022-08-19 DIAGNOSIS — Z96641 Presence of right artificial hip joint: Secondary | ICD-10-CM | POA: Diagnosis not present

## 2022-08-19 DIAGNOSIS — K5900 Constipation, unspecified: Secondary | ICD-10-CM | POA: Diagnosis not present

## 2022-08-19 DIAGNOSIS — Z9181 History of falling: Secondary | ICD-10-CM | POA: Diagnosis not present

## 2022-08-19 DIAGNOSIS — J302 Other seasonal allergic rhinitis: Secondary | ICD-10-CM | POA: Diagnosis not present

## 2022-08-26 ENCOUNTER — Other Ambulatory Visit: Payer: Self-pay | Admitting: Internal Medicine

## 2022-08-26 DIAGNOSIS — Z96641 Presence of right artificial hip joint: Secondary | ICD-10-CM | POA: Diagnosis not present

## 2022-08-26 DIAGNOSIS — Z471 Aftercare following joint replacement surgery: Secondary | ICD-10-CM | POA: Diagnosis not present

## 2022-08-26 DIAGNOSIS — Z791 Long term (current) use of non-steroidal anti-inflammatories (NSAID): Secondary | ICD-10-CM | POA: Diagnosis not present

## 2022-08-26 DIAGNOSIS — Z9181 History of falling: Secondary | ICD-10-CM | POA: Diagnosis not present

## 2022-08-26 DIAGNOSIS — Z7982 Long term (current) use of aspirin: Secondary | ICD-10-CM | POA: Diagnosis not present

## 2022-08-26 DIAGNOSIS — M810 Age-related osteoporosis without current pathological fracture: Secondary | ICD-10-CM

## 2022-08-26 DIAGNOSIS — K5900 Constipation, unspecified: Secondary | ICD-10-CM | POA: Diagnosis not present

## 2022-08-26 DIAGNOSIS — N959 Unspecified menopausal and perimenopausal disorder: Secondary | ICD-10-CM | POA: Diagnosis not present

## 2022-08-26 DIAGNOSIS — J302 Other seasonal allergic rhinitis: Secondary | ICD-10-CM | POA: Diagnosis not present

## 2022-08-26 DIAGNOSIS — M1990 Unspecified osteoarthritis, unspecified site: Secondary | ICD-10-CM | POA: Diagnosis not present

## 2022-08-29 DIAGNOSIS — N959 Unspecified menopausal and perimenopausal disorder: Secondary | ICD-10-CM | POA: Diagnosis not present

## 2022-08-29 DIAGNOSIS — Z791 Long term (current) use of non-steroidal anti-inflammatories (NSAID): Secondary | ICD-10-CM | POA: Diagnosis not present

## 2022-08-29 DIAGNOSIS — K5900 Constipation, unspecified: Secondary | ICD-10-CM | POA: Diagnosis not present

## 2022-08-29 DIAGNOSIS — Z471 Aftercare following joint replacement surgery: Secondary | ICD-10-CM | POA: Diagnosis not present

## 2022-08-29 DIAGNOSIS — J302 Other seasonal allergic rhinitis: Secondary | ICD-10-CM | POA: Diagnosis not present

## 2022-08-29 DIAGNOSIS — Z9181 History of falling: Secondary | ICD-10-CM | POA: Diagnosis not present

## 2022-08-29 DIAGNOSIS — Z96641 Presence of right artificial hip joint: Secondary | ICD-10-CM | POA: Diagnosis not present

## 2022-08-29 DIAGNOSIS — M1990 Unspecified osteoarthritis, unspecified site: Secondary | ICD-10-CM | POA: Diagnosis not present

## 2022-08-29 DIAGNOSIS — Z7982 Long term (current) use of aspirin: Secondary | ICD-10-CM | POA: Diagnosis not present

## 2022-09-02 DIAGNOSIS — Z791 Long term (current) use of non-steroidal anti-inflammatories (NSAID): Secondary | ICD-10-CM | POA: Diagnosis not present

## 2022-09-02 DIAGNOSIS — Z7982 Long term (current) use of aspirin: Secondary | ICD-10-CM | POA: Diagnosis not present

## 2022-09-02 DIAGNOSIS — N959 Unspecified menopausal and perimenopausal disorder: Secondary | ICD-10-CM | POA: Diagnosis not present

## 2022-09-02 DIAGNOSIS — M1990 Unspecified osteoarthritis, unspecified site: Secondary | ICD-10-CM | POA: Diagnosis not present

## 2022-09-02 DIAGNOSIS — Z9181 History of falling: Secondary | ICD-10-CM | POA: Diagnosis not present

## 2022-09-02 DIAGNOSIS — Z471 Aftercare following joint replacement surgery: Secondary | ICD-10-CM | POA: Diagnosis not present

## 2022-09-02 DIAGNOSIS — J302 Other seasonal allergic rhinitis: Secondary | ICD-10-CM | POA: Diagnosis not present

## 2022-09-02 DIAGNOSIS — K5900 Constipation, unspecified: Secondary | ICD-10-CM | POA: Diagnosis not present

## 2022-09-02 DIAGNOSIS — Z96641 Presence of right artificial hip joint: Secondary | ICD-10-CM | POA: Diagnosis not present

## 2022-09-08 DIAGNOSIS — K5900 Constipation, unspecified: Secondary | ICD-10-CM | POA: Diagnosis not present

## 2022-09-08 DIAGNOSIS — J302 Other seasonal allergic rhinitis: Secondary | ICD-10-CM | POA: Diagnosis not present

## 2022-09-08 DIAGNOSIS — M1990 Unspecified osteoarthritis, unspecified site: Secondary | ICD-10-CM | POA: Diagnosis not present

## 2022-09-08 DIAGNOSIS — Z9181 History of falling: Secondary | ICD-10-CM | POA: Diagnosis not present

## 2022-09-08 DIAGNOSIS — N959 Unspecified menopausal and perimenopausal disorder: Secondary | ICD-10-CM | POA: Diagnosis not present

## 2022-09-08 DIAGNOSIS — Z471 Aftercare following joint replacement surgery: Secondary | ICD-10-CM | POA: Diagnosis not present

## 2022-09-08 DIAGNOSIS — Z96641 Presence of right artificial hip joint: Secondary | ICD-10-CM | POA: Diagnosis not present

## 2022-09-08 DIAGNOSIS — Z791 Long term (current) use of non-steroidal anti-inflammatories (NSAID): Secondary | ICD-10-CM | POA: Diagnosis not present

## 2022-09-08 DIAGNOSIS — Z7982 Long term (current) use of aspirin: Secondary | ICD-10-CM | POA: Diagnosis not present

## 2022-09-09 DIAGNOSIS — Z96641 Presence of right artificial hip joint: Secondary | ICD-10-CM | POA: Diagnosis not present

## 2022-09-25 ENCOUNTER — Ambulatory Visit: Payer: Medicare Other | Admitting: Physician Assistant

## 2022-10-06 ENCOUNTER — Encounter: Payer: Self-pay | Admitting: Physician Assistant

## 2022-10-06 ENCOUNTER — Ambulatory Visit (INDEPENDENT_AMBULATORY_CARE_PROVIDER_SITE_OTHER): Payer: Medicare Other | Admitting: Physician Assistant

## 2022-10-06 VITALS — BP 110/75 | HR 74 | Temp 97.8°F | Resp 16 | Ht 64.0 in | Wt 144.6 lb

## 2022-10-06 DIAGNOSIS — R300 Dysuria: Secondary | ICD-10-CM

## 2022-10-06 DIAGNOSIS — E559 Vitamin D deficiency, unspecified: Secondary | ICD-10-CM | POA: Diagnosis not present

## 2022-10-06 DIAGNOSIS — E538 Deficiency of other specified B group vitamins: Secondary | ICD-10-CM

## 2022-10-06 DIAGNOSIS — E782 Mixed hyperlipidemia: Secondary | ICD-10-CM | POA: Diagnosis not present

## 2022-10-06 DIAGNOSIS — R5383 Other fatigue: Secondary | ICD-10-CM | POA: Diagnosis not present

## 2022-10-06 DIAGNOSIS — Z0001 Encounter for general adult medical examination with abnormal findings: Secondary | ICD-10-CM | POA: Diagnosis not present

## 2022-10-06 DIAGNOSIS — Z1231 Encounter for screening mammogram for malignant neoplasm of breast: Secondary | ICD-10-CM

## 2022-10-06 DIAGNOSIS — R946 Abnormal results of thyroid function studies: Secondary | ICD-10-CM | POA: Diagnosis not present

## 2022-10-06 DIAGNOSIS — Z96641 Presence of right artificial hip joint: Secondary | ICD-10-CM

## 2022-10-06 NOTE — Progress Notes (Signed)
[facility] [address] [place], [place] [address]  Internal MEDICINE  Office Visit Note  Patient Name: Mary West  [id number]  [id number]  Date of Service: 10/14/2022  Chief Complaint  Patient presents with   Medicare Wellness   Sleeping Problem    HPI Mary West presents for an annual well visit and physical exam.  Well-appearing 67 y.o. female  Routine CRC screening: UTD Routine mammogram: ordered DEXA scan: UTD Eye exam: Will schedule Labs: will order labs, has been taking 2 tabs of 750mg  calcium due to being low before and needs it to be rechecked Other concerns: Doing well since Right hip replacement, doing well  since surgery. Does still have some numbness and swelling, but otherwise doing well. Home PT discharged her and she is doing exercises on her own now. Goes back to surgeon again in Sept. -Sleeping problem recently. Taking melatonin which is helping currently, but is worried if this doesn't work what next step would be. Is already avoiding late caffeine and napping. May try trazodone in future if needed.     10/06/2022   10:41 AM 09/19/2021    8:56 AM 09/11/2020    9:34 AM  MMSE - Mini Mental State Exam  Orientation to time 5 5 5   Orientation to Place 5 5 5   Registration 3 3 3   Attention/ Calculation 5 5 5   Recall 3 3 3   Language- name 2 objects 2 2 2   Language- repeat 1 1 1   Language- follow 3 step command 3 3 3   Language- read & follow direction 1 1 1   Write a sentence 1 1 1   Copy design 1 1 1   Total score 30 30 30     Functional Status Survey: Is the patient deaf or have difficulty hearing?: No Does the patient have difficulty seeing, even when wearing glasses/contacts?: No Does the patient have difficulty concentrating, remembering, or making decisions?: No Does the patient have difficulty walking or climbing stairs?: No Does the patient have difficulty dressing or bathing?: No Does the patient have difficulty doing errands alone  such as visiting a doctor's office or shopping?: No     12/17/2020    8:28 AM 09/19/2021    8:56 AM 01/30/2022   11:18 AM 07/14/2022    9:34 AM 10/06/2022   10:41 AM  Fall Risk  Falls in the past year?  0 0 0 0  (RETIRED) Patient Fall Risk Level Low fall risk           10/06/2022   10:41 AM  Depression screen PHQ 2/9  Decreased Interest 0  Down, Depressed, Hopeless 0  PHQ - 2 Score 0        No data to display            Current Medication: Outpatient Encounter Medications as of 10/06/2022  Medication Sig   acetaminophen (TYLENOL) 500 MG tablet Take 2 tablets (1,000 mg total) by mouth every 8 (eight) hours.   calcium carbonate (OS-CAL) 600 MG TABS tablet Take 750 mg by mouth daily.   calcium carbonate (TUMS - DOSED IN MG ELEMENTAL CALCIUM) 500 MG chewable tablet Chew 1 tablet by mouth daily.   docusate sodium (COLACE) 100 MG capsule Take 1 capsule (100 mg total) by mouth 2 (two) times daily.   famotidine (PEPCID) 20 MG tablet Take 20 mg by mouth 2 (two) times daily.   ibandronate (BONIVA) 150 MG tablet TAKE 1 TABLET IN AM ONCE A MONTH ON EMPTY STOMACH. DO NOT  TAKE ANYTHING ELSE BY MOUTH OR LIE DOWN FOR THE NEXT 30 MINUTES   Multiple Vitamins-Minerals (MULTIVITAMIN ADULTS PO) Take by mouth daily. Centrum silver 50 vitamin C 135 mg zinc   ondansetron (ZOFRAN) 4 MG tablet Take 1 tablet (4 mg total) by mouth every 6 (six) hours as needed for nausea.   OVER THE COUNTER MEDICATION Calcium 750 mg and vitamin D 500 IU and vitamin K 40 mcg   oxyCODONE (ROXICODONE) 5 MG immediate release tablet Take 0.5 tablets (2.5 mg total) by mouth every 6 (six) hours as needed for breakthrough pain.   Phenylephrine-Acetaminophen (TYLENOL SINUS CONGESTION/PAIN PO) Take by mouth.   traMADol (ULTRAM) 50 MG tablet Take 1 tablet (50 mg total) by mouth every 6 (six) hours as needed for moderate pain.   enoxaparin (LOVENOX) 40 MG/0.4ML injection Inject 0.4 mLs (40 mg total) into the skin daily for 14 days.    No facility-administered encounter medications on file as of 10/06/2022.    Surgical History: Past Surgical History:  Procedure Laterality Date   COLONOSCOPY     COLONOSCOPY WITH PROPOFOL N/A 12/17/2020   Procedure: COLONOSCOPY WITH PROPOFOL;  Surgeon: Midge Minium, MD;  Location: [facility];  Service: Endoscopy;  Laterality: N/A;   LEEP     TOTAL HIP ARTHROPLASTY Right 07/28/2022   Procedure: TOTAL HIP ARTHROPLASTY ANTERIOR APPROACH;  Surgeon: Reinaldo Berber, MD;  Location: [facility];  Service: Orthopedics;  Laterality: Right;    Medical History: Past Medical History:  Diagnosis Date   Allergy    Heart murmur     Family History: Family History  Problem Relation Age of Onset   Colon cancer Mother    Breast cancer Mother 4   COPD Father     Social History   Socioeconomic History   Marital status: Single    Spouse name: Not on file   Number of children: Not on file   Years of education: Not on file   Highest education level: Not on file  Occupational History   Not on file  Tobacco Use   Smoking status: Never   Smokeless tobacco: Never  Vaping Use   Vaping status: Never Used  Substance and Sexual Activity   Alcohol use: Yes    Comment: occasionally    Drug use: Never   Sexual activity: Not on file  Other Topics Concern   Not on file  Social History Narrative   Not on file   Social Determinants of Health   Financial Resource Strain: Not on file  Food Insecurity: No Food Insecurity (07/28/2022)   Hunger Vital Sign    Worried About Running Out of Food in the Last Year: Never true    Ran Out of Food in the Last Year: Never true  Transportation Needs: No Transportation Needs (07/28/2022)   PRAPARE - Administrator, Civil Service (Medical): No    Lack of Transportation (Non-Medical): No  Physical Activity: Not on file  Stress: Not on file  Social Connections: Not on file  Intimate Partner Violence: Not At Risk (07/28/2022)   Humiliation,  Afraid, Rape, and Kick questionnaire    Fear of Current or Ex-Partner: No    Emotionally Abused: No    Physically Abused: No    Sexually Abused: No      Review of Systems  Constitutional:  Negative for chills, fatigue and unexpected weight change.  HENT:  Negative for congestion, postnasal drip, rhinorrhea, sneezing and sore throat.   Eyes:  Negative for  redness.  Respiratory:  Negative for cough, chest tightness and shortness of breath.   Cardiovascular:  Negative for chest pain and palpitations.  Gastrointestinal:  Negative for abdominal pain, constipation, diarrhea, nausea and vomiting.  Genitourinary:  Negative for dysuria and frequency.  Musculoskeletal:  Negative for joint swelling and neck pain.  Skin:  Negative for rash.  Neurological: Negative.  Negative for tremors and numbness.  Hematological:  Negative for adenopathy. Does not bruise/bleed easily.  Psychiatric/Behavioral:  Negative for behavioral problems (Depression), sleep disturbance and suicidal ideas. The patient is not nervous/anxious.     Vital Signs: BP 110/75   Pulse 74   Temp 97.8 F (36.6 C)   Resp 16   Ht 5\' 4"  (1.626 m)   Wt 144 lb 9.6 oz (65.6 kg)   SpO2 94%   BMI 24.82 kg/m    Physical Exam Constitutional:      General: She is not in acute distress.    Appearance: She is well-developed. She is not diaphoretic.  HENT:     Head: Normocephalic and atraumatic.     Mouth/Throat:     Pharynx: No oropharyngeal exudate.  Eyes:     Pupils: Pupils are equal, round, and reactive to light.  Neck:     Thyroid: No thyromegaly.     Vascular: No JVD.     Trachea: No tracheal deviation.  Cardiovascular:     Rate and Rhythm: Normal rate and regular rhythm.     Heart sounds: Normal heart sounds. No murmur heard.    No friction rub. No gallop.  Pulmonary:     Effort: Pulmonary effort is normal. No respiratory distress.     Breath sounds: No wheezing or rales.  Chest:     Chest wall: No tenderness.   Abdominal:     General: Bowel sounds are normal.     Palpations: Abdomen is soft.     Tenderness: There is no abdominal tenderness.  Musculoskeletal:        General: Normal range of motion.     Cervical back: Normal range of motion and neck supple.  Lymphadenopathy:     Cervical: No cervical adenopathy.  Skin:    General: Skin is warm and dry.  Neurological:     Mental Status: She is alert and oriented to person, place, and time.     Cranial Nerves: No cranial nerve deficit.  Psychiatric:        Behavior: Behavior normal.        Thought Content: Thought content normal.        Judgment: Judgment normal.        Assessment/Plan: 1. Encounter for general adult medical examination with abnormal findings CPE performed, labs ordered, due for mammo  2. Status post total replacement of right hip Doing well since surgery, followed by ortho  3. Mixed hyperlipidemia - Lipid Panel With LDL/HDL Ratio  4. Abnormal thyroid exam - TSH + free T4  5. Vitamin D deficiency - VITAMIN D 25 Hydroxy (Vit-D Deficiency, Fractures)  6. B12 deficiency - B12 and Folate Panel  7. Other fatigue - CBC w/Diff/Platelet - Comprehensive metabolic panel - TSH + free T4 - Lipid Panel With LDL/HDL Ratio - VITAMIN D 25 Hydroxy (Vit-D Deficiency, Fractures) - B12 and Folate Panel  8. Visit for screening mammogram - MM 3D SCREENING MAMMOGRAM BILATERAL BREAST; Future  9. Dysuria - UA/M w/rflx Culture, Routine     General Counseling: Vetta verbalizes understanding of the findings of todays visit and agrees  with plan of treatment. I have discussed any further diagnostic evaluation that may be needed or ordered today. We also reviewed her medications today. she has been encouraged to call the office with any questions or concerns that should arise related to todays visit.    Orders Placed This Encounter  Procedures   Microscopic Examination   MM 3D SCREENING MAMMOGRAM BILATERAL BREAST   UA/M  w/rflx Culture, Routine   CBC w/Diff/Platelet   Comprehensive metabolic panel   TSH + free T4   Lipid Panel With LDL/HDL Ratio   VITAMIN D 25 Hydroxy (Vit-D Deficiency, Fractures)   B12 and Folate Panel    No orders of the defined types were placed in this encounter.   Return in about 6 months (around 04/08/2023) for general follow up.   Total time spent:35 Minutes Time spent includes review of chart, medications, test results, and follow up plan with the patient.    Controlled Substance Database was reviewed by me.  This patient was seen by Lynn Ito, PA-C in collaboration with Dr. Beverely Risen as a part of collaborative care agreement.  Lynn Ito, PA-C Internal medicine

## 2022-10-09 DIAGNOSIS — K080 Exfoliation of teeth due to systemic causes: Secondary | ICD-10-CM | POA: Diagnosis not present

## 2022-10-10 ENCOUNTER — Telehealth: Payer: Self-pay

## 2022-10-10 NOTE — Telephone Encounter (Signed)
Spoke with patient regarding lab results. 

## 2022-10-10 NOTE — Telephone Encounter (Signed)
-----   Message from Carlean Jews sent at 10/10/2022 12:41 PM EDT ----- Please let her know that overall her labs look good. Her alk phos was a little elevated which can correlate with changes with bones and may be due to recent surgery. Cholesterol further improved, still just slightly elevated. Vit D improved but still low normal--continue supplement, calcium looks good so continue supplement

## 2022-11-11 DIAGNOSIS — Z96641 Presence of right artificial hip joint: Secondary | ICD-10-CM | POA: Diagnosis not present

## 2022-11-18 ENCOUNTER — Ambulatory Visit
Admission: RE | Admit: 2022-11-18 | Discharge: 2022-11-18 | Disposition: A | Discharge status: Home / Self Care | Payer: Medicare Other | Source: Ambulatory Visit | Attending: Physician Assistant | Admitting: Physician Assistant

## 2022-11-18 DIAGNOSIS — Z1231 Encounter for screening mammogram for malignant neoplasm of breast: Secondary | ICD-10-CM | POA: Insufficient documentation

## 2022-11-28 ENCOUNTER — Encounter: Payer: Self-pay | Admitting: Physician Assistant

## 2022-11-28 ENCOUNTER — Ambulatory Visit (INDEPENDENT_AMBULATORY_CARE_PROVIDER_SITE_OTHER): Payer: Medicare Other | Admitting: Physician Assistant

## 2022-11-28 VITALS — BP 110/70 | HR 65 | Temp 97.9°F | Resp 16 | Ht 65.0 in | Wt 144.0 lb

## 2022-11-28 DIAGNOSIS — N390 Urinary tract infection, site not specified: Secondary | ICD-10-CM | POA: Diagnosis not present

## 2022-11-28 DIAGNOSIS — R319 Hematuria, unspecified: Secondary | ICD-10-CM

## 2022-11-28 DIAGNOSIS — R300 Dysuria: Secondary | ICD-10-CM | POA: Diagnosis not present

## 2022-11-28 LAB — POCT URINALYSIS DIPSTICK
Bilirubin, UA: NEGATIVE
Glucose, UA: NEGATIVE
Ketones, UA: NEGATIVE
Nitrite, UA: NEGATIVE
Protein, UA: NEGATIVE
Spec Grav, UA: 1.01 (ref 1.010–1.025)
Urobilinogen, UA: 0.2 U/dL
pH, UA: 5 (ref 5.0–8.0)

## 2022-11-28 MED ORDER — NITROFURANTOIN MONOHYD MACRO 100 MG PO CAPS
ORAL_CAPSULE | ORAL | 0 refills | Status: DC
Start: 2022-11-28 — End: 2023-02-06

## 2022-11-28 NOTE — Progress Notes (Signed)
[facility] [address] [place], [place] [address]  Internal MEDICINE  Office Visit Note  Patient Name: Mary West  [id number]  [id number]  Date of Service: 12/03/2022  Chief Complaint  Patient presents with   Urinary Tract Infection     HPI Pt is here for a sick visit. -UTI symptoms started yesterday. She has a hx of UTIs and this feels similar -Painful urination, urgency, frequency -drinking plenty of water  Current Medication:  Outpatient Encounter Medications as of 11/28/2022  Medication Sig   acetaminophen (TYLENOL) 500 MG tablet Take 2 tablets (1,000 mg total) by mouth every 8 (eight) hours.   calcium carbonate (OS-CAL) 600 MG TABS tablet Take 750 mg by mouth daily.   calcium carbonate (TUMS - DOSED IN MG ELEMENTAL CALCIUM) 500 MG chewable tablet Chew 1 tablet by mouth daily.   docusate sodium (COLACE) 100 MG capsule Take 1 capsule (100 mg total) by mouth 2 (two) times daily.   famotidine (PEPCID) 20 MG tablet Take 20 mg by mouth 2 (two) times daily.   ibandronate (BONIVA) 150 MG tablet TAKE 1 TABLET IN AM ONCE A MONTH ON EMPTY STOMACH. DO NOT TAKE ANYTHING ELSE BY MOUTH OR LIE DOWN FOR THE NEXT 30 MINUTES   Multiple Vitamins-Minerals (MULTIVITAMIN ADULTS PO) Take by mouth daily. Centrum silver 50 vitamin C 135 mg zinc   nitrofurantoin, macrocrystal-monohydrate, (MACROBID) 100 MG capsule Take 1 cap twice per day for 10 days.   ondansetron (ZOFRAN) 4 MG tablet Take 1 tablet (4 mg total) by mouth every 6 (six) hours as needed for nausea.   OVER THE COUNTER MEDICATION Calcium 750 mg and vitamin D 500 IU and vitamin K 40 mcg   oxyCODONE (ROXICODONE) 5 MG immediate release tablet Take 0.5 tablets (2.5 mg total) by mouth every 6 (six) hours as needed for breakthrough pain.   Phenylephrine-Acetaminophen (TYLENOL SINUS CONGESTION/PAIN PO) Take by mouth.   traMADol (ULTRAM) 50 MG tablet Take 1 tablet (50 mg total) by mouth every 6 (six) hours as needed for  moderate pain.   enoxaparin (LOVENOX) 40 MG/0.4ML injection Inject 0.4 mLs (40 mg total) into the skin daily for 14 days.   No facility-administered encounter medications on file as of 11/28/2022.      Medical History: Past Medical History:  Diagnosis Date   Allergy    Heart murmur      Vital Signs: BP 110/70   Pulse 65   Temp 97.9 F (36.6 C)   Resp 16   Ht 5\' 5"  (1.651 m)   Wt 144 lb (65.3 kg)   SpO2 98%   BMI 23.96 kg/m    Review of Systems  Constitutional:  Negative for fatigue and fever.  HENT:  Negative for congestion, mouth sores and postnasal drip.   Respiratory:  Negative for cough.   Cardiovascular:  Negative for chest pain.  Genitourinary:  Positive for dysuria, frequency and urgency. Negative for flank pain.  Psychiatric/Behavioral: Negative.      Physical Exam Vitals and nursing note reviewed.  Constitutional:      General: She is not in acute distress.    Appearance: Normal appearance. She is normal weight. She is not ill-appearing.  HENT:     Head: Normocephalic and atraumatic.  Eyes:     Pupils: Pupils are equal, round, and reactive to light.  Cardiovascular:     Rate and Rhythm: Normal rate and regular rhythm.  Pulmonary:     Effort: Pulmonary effort is normal. No  respiratory distress.  Neurological:     Mental Status: She is alert and oriented to person, place, and time.  Psychiatric:        Mood and Affect: Mood normal.        Behavior: Behavior normal.       Assessment/Plan: 1. Dysuria - POCT Urinalysis Dipstick  2. Urinary tract infection with hematuria, site unspecified Will go ahead and treat with macrobid and adjust based on C/S - CULTURE, URINE COMPREHENSIVE - nitrofurantoin, macrocrystal-monohydrate, (MACROBID) 100 MG capsule; Take 1 cap twice per day for 10 days.  Dispense: 20 capsule; Refill: 0   General Counseling: Verner verbalizes understanding of the findings of todays visit and agrees with plan of treatment. I have  discussed any further diagnostic evaluation that may be needed or ordered today. We also reviewed her medications today. she has been encouraged to call the office with any questions or concerns that should arise related to todays visit.    Counseling:    Orders Placed This Encounter  Procedures   CULTURE, URINE COMPREHENSIVE   POCT Urinalysis Dipstick    Meds ordered this encounter  Medications   nitrofurantoin, macrocrystal-monohydrate, (MACROBID) 100 MG capsule    Sig: Take 1 cap twice per day for 10 days.    Dispense:  20 capsule    Refill:  0    Time spent:25 Minutes

## 2022-12-03 LAB — CULTURE, URINE COMPREHENSIVE

## 2022-12-12 DIAGNOSIS — D485 Neoplasm of uncertain behavior of skin: Secondary | ICD-10-CM | POA: Diagnosis not present

## 2022-12-12 DIAGNOSIS — D2261 Melanocytic nevi of right upper limb, including shoulder: Secondary | ICD-10-CM | POA: Diagnosis not present

## 2022-12-12 DIAGNOSIS — D225 Melanocytic nevi of trunk: Secondary | ICD-10-CM | POA: Diagnosis not present

## 2022-12-12 DIAGNOSIS — D2262 Melanocytic nevi of left upper limb, including shoulder: Secondary | ICD-10-CM | POA: Diagnosis not present

## 2022-12-12 DIAGNOSIS — L820 Inflamed seborrheic keratosis: Secondary | ICD-10-CM | POA: Diagnosis not present

## 2022-12-12 DIAGNOSIS — D2271 Melanocytic nevi of right lower limb, including hip: Secondary | ICD-10-CM | POA: Diagnosis not present

## 2023-01-14 DIAGNOSIS — M7502 Adhesive capsulitis of left shoulder: Secondary | ICD-10-CM | POA: Diagnosis not present

## 2023-02-06 ENCOUNTER — Ambulatory Visit (INDEPENDENT_AMBULATORY_CARE_PROVIDER_SITE_OTHER): Payer: Medicare Other | Admitting: Nurse Practitioner

## 2023-02-06 ENCOUNTER — Encounter: Payer: Self-pay | Admitting: Nurse Practitioner

## 2023-02-06 VITALS — BP 120/74 | HR 68 | Temp 98.3°F | Resp 16 | Ht 65.0 in | Wt 142.0 lb

## 2023-02-06 DIAGNOSIS — R319 Hematuria, unspecified: Secondary | ICD-10-CM

## 2023-02-06 DIAGNOSIS — N3001 Acute cystitis with hematuria: Secondary | ICD-10-CM | POA: Diagnosis not present

## 2023-02-06 DIAGNOSIS — N390 Urinary tract infection, site not specified: Secondary | ICD-10-CM | POA: Diagnosis not present

## 2023-02-06 DIAGNOSIS — R300 Dysuria: Secondary | ICD-10-CM | POA: Diagnosis not present

## 2023-02-06 LAB — POCT URINALYSIS DIPSTICK
Bilirubin, UA: NEGATIVE
Glucose, UA: NEGATIVE
Nitrite, UA: NEGATIVE
Protein, UA: NEGATIVE
Spec Grav, UA: <=1.005 — AB (ref 1.010–1.025)
Urobilinogen, UA: 0.2 U/dL
pH, UA: 7 (ref 5.0–8.0)

## 2023-02-06 MED ORDER — CIPROFLOXACIN HCL 500 MG PO TABS
500.0000 mg | ORAL_TABLET | Freq: Two times a day (BID) | ORAL | 0 refills | Status: AC
Start: 2023-02-06 — End: 2023-02-11

## 2023-02-06 NOTE — Progress Notes (Signed)
[facility] [address] [place], [place] [address]  Internal MEDICINE  Office Visit Note  Patient Name: Mary West  [id number]  [id number]  Date of Service: 02/06/2023  Chief Complaint  Patient presents with   Urinary Tract Infection     HPI Mary West presents for an acute sick visit for possible UTI.  --onset of symptoms was yesterday --reports urinary discomfort, suprapubic pressure, urgency, and frequency.  --urinalysis is positive for large blood and large leukocytes. Negative for nitrites.  --this is her 3rd UTI this year.    Current Medication:  Outpatient Encounter Medications as of 02/06/2023  Medication Sig   ciprofloxacin (CIPRO) 500 MG tablet Take 1 tablet (500 mg total) by mouth 2 (two) times daily for 5 days. Take with food.   acetaminophen (TYLENOL) 500 MG tablet Take 2 tablets (1,000 mg total) by mouth every 8 (eight) hours.   calcium carbonate (OS-CAL) 600 MG TABS tablet Take 750 mg by mouth daily.   calcium carbonate (TUMS - DOSED IN MG ELEMENTAL CALCIUM) 500 MG chewable tablet Chew 1 tablet by mouth daily.   docusate sodium (COLACE) 100 MG capsule Take 1 capsule (100 mg total) by mouth 2 (two) times daily.   enoxaparin (LOVENOX) 40 MG/0.4ML injection Inject 0.4 mLs (40 mg total) into the skin daily for 14 days.   famotidine (PEPCID) 20 MG tablet Take 20 mg by mouth 2 (two) times daily.   ibandronate (BONIVA) 150 MG tablet TAKE 1 TABLET IN AM ONCE A MONTH ON EMPTY STOMACH. DO NOT TAKE ANYTHING ELSE BY MOUTH OR LIE DOWN FOR THE NEXT 30 MINUTES   Multiple Vitamins-Minerals (MULTIVITAMIN ADULTS PO) Take by mouth daily. Centrum silver 50 vitamin C 135 mg zinc   ondansetron (ZOFRAN) 4 MG tablet Take 1 tablet (4 mg total) by mouth every 6 (six) hours as needed for nausea.   OVER THE COUNTER MEDICATION Calcium 750 mg and vitamin D 500 IU and vitamin K 40 mcg   oxyCODONE (ROXICODONE) 5 MG immediate release tablet Take 0.5 tablets (2.5 mg total)  by mouth every 6 (six) hours as needed for breakthrough pain.   Phenylephrine-Acetaminophen (TYLENOL SINUS CONGESTION/PAIN PO) Take by mouth.   traMADol (ULTRAM) 50 MG tablet Take 1 tablet (50 mg total) by mouth every 6 (six) hours as needed for moderate pain.   [DISCONTINUED] nitrofurantoin, macrocrystal-monohydrate, (MACROBID) 100 MG capsule Take 1 cap twice per day for 10 days.   No facility-administered encounter medications on file as of 02/06/2023.      Medical History: Past Medical History:  Diagnosis Date   Allergy    Heart murmur      Vital Signs: BP 120/74   Pulse 68   Temp 98.3 F (36.8 C)   Resp 16   Ht 5\' 5"  (1.651 m)   Wt 142 lb (64.4 kg)   SpO2 99%   BMI 23.63 kg/m    Review of Systems  Constitutional:  Negative for chills and fever.  Respiratory: Negative.  Negative for cough, chest tightness, shortness of breath and wheezing.   Cardiovascular: Negative.  Negative for chest pain and palpitations.  Genitourinary:  Positive for decreased urine volume, difficulty urinating, dysuria, frequency and urgency. Negative for flank pain and pelvic pain.  Musculoskeletal:  Negative for back pain.    Physical Exam Vitals reviewed.  Constitutional:      General: She is not in acute distress.    Appearance: Normal appearance. She is normal weight. She is not ill-appearing.  HENT:     Head: Normocephalic and atraumatic.  Eyes:     Pupils: Pupils are equal, round, and reactive to light.  Pulmonary:     Effort: Pulmonary effort is normal. No respiratory distress.  Abdominal:     Tenderness: There is abdominal tenderness in the suprapubic area.  Neurological:     Mental Status: She is alert and oriented to person, place, and time.  Psychiatric:        Mood and Affect: Mood normal.        Behavior: Behavior normal.       Assessment/Plan: 1. Acute cystitis with hematuria (Primary) Ciprofloxacin prescribed, take until gone. Urine culture sent.  - CULTURE,  URINE COMPREHENSIVE - ciprofloxacin (CIPRO) 500 MG tablet; Take 1 tablet (500 mg total) by mouth 2 (two) times daily for 5 days. Take with food.  Dispense: 10 tablet; Refill: 0  2. Dysuria Urinalysis positive for large blood and large leukocytes. Urine culture sent.  - POCT urinalysis dipstick - CULTURE, URINE COMPREHENSIVE   General Counseling: Jevaeh verbalizes understanding of the findings of todays visit and agrees with plan of treatment. I have discussed any further diagnostic evaluation that may be needed or ordered today. We also reviewed her medications today. she has been encouraged to call the office with any questions or concerns that should arise related to todays visit.    Counseling:    Orders Placed This Encounter  Procedures   CULTURE, URINE COMPREHENSIVE   POCT urinalysis dipstick    Meds ordered this encounter  Medications   ciprofloxacin (CIPRO) 500 MG tablet    Sig: Take 1 tablet (500 mg total) by mouth 2 (two) times daily for 5 days. Take with food.    Dispense:  10 tablet    Refill:  0    Return if symptoms worsen or fail to improve.  [place] Controlled Substance Database was reviewed by me for overdose risk score (ORS)  Time spent:20 Minutes Time spent with patient included reviewing progress notes, labs, imaging studies, and discussing plan for follow up.   This patient was seen by Sallyanne Kuster, FNP-C in collaboration with Dr. Beverely Risen as a part of collaborative care agreement.  Pellegrino Kennard R. Tedd Sias, MSN, FNP-C Internal Medicine

## 2023-02-11 LAB — CULTURE, URINE COMPREHENSIVE

## 2023-02-12 DIAGNOSIS — M7502 Adhesive capsulitis of left shoulder: Secondary | ICD-10-CM | POA: Diagnosis not present

## 2023-03-13 DIAGNOSIS — Z96641 Presence of right artificial hip joint: Secondary | ICD-10-CM | POA: Diagnosis not present

## 2023-03-23 ENCOUNTER — Encounter: Payer: Self-pay | Admitting: Physician Assistant

## 2023-03-23 ENCOUNTER — Ambulatory Visit (INDEPENDENT_AMBULATORY_CARE_PROVIDER_SITE_OTHER): Payer: Medicare Other | Admitting: Physician Assistant

## 2023-03-23 VITALS — BP 120/65 | HR 87 | Temp 98.2°F | Resp 16 | Ht 65.0 in | Wt 148.0 lb

## 2023-03-23 DIAGNOSIS — N390 Urinary tract infection, site not specified: Secondary | ICD-10-CM | POA: Diagnosis not present

## 2023-03-23 DIAGNOSIS — R300 Dysuria: Secondary | ICD-10-CM

## 2023-03-23 DIAGNOSIS — R319 Hematuria, unspecified: Secondary | ICD-10-CM

## 2023-03-23 LAB — POCT URINALYSIS DIPSTICK
Bilirubin, UA: NEGATIVE
Glucose, UA: NEGATIVE
Ketones, UA: POSITIVE
Nitrite, UA: NEGATIVE
Protein, UA: NEGATIVE
Spec Grav, UA: 1.025 (ref 1.010–1.025)
Urobilinogen, UA: 0.2 U/dL
pH, UA: 5 (ref 5.0–8.0)

## 2023-03-23 MED ORDER — NITROFURANTOIN MONOHYD MACRO 100 MG PO CAPS
ORAL_CAPSULE | ORAL | 0 refills | Status: DC
Start: 2023-03-23 — End: 2023-04-09

## 2023-03-23 NOTE — Progress Notes (Signed)
[facility] [address] [place], [place] [address]  Internal MEDICINE  Office Visit Note  Patient Name: Mary West  [id number]  [id number]  Date of Service: 04/05/2023  Chief Complaint  Patient presents with   Acute Visit   Urinary Tract Infection    Burning upon urination, especially at end of stream     HPI Pt is here for a sick visit. -UTI symptoms, burning with urination especially at end of stream.  -Some urgency, incomplete emptying.  -Started yesterday morning -hydrating  Current Medication:  Outpatient Encounter Medications as of 03/23/2023  Medication Sig   acetaminophen (TYLENOL) 500 MG tablet Take 2 tablets (1,000 mg total) by mouth every 8 (eight) hours.   calcium carbonate (OS-CAL) 600 MG TABS tablet Take 750 mg by mouth daily.   calcium carbonate (TUMS - DOSED IN MG ELEMENTAL CALCIUM) 500 MG chewable tablet Chew 1 tablet by mouth daily.   docusate sodium (COLACE) 100 MG capsule Take 1 capsule (100 mg total) by mouth 2 (two) times daily.   famotidine (PEPCID) 20 MG tablet Take 20 mg by mouth 2 (two) times daily.   ibandronate (BONIVA) 150 MG tablet TAKE 1 TABLET IN AM ONCE A MONTH ON EMPTY STOMACH. DO NOT TAKE ANYTHING ELSE BY MOUTH OR LIE DOWN FOR THE NEXT 30 MINUTES   Multiple Vitamins-Minerals (MULTIVITAMIN ADULTS PO) Take by mouth daily. Centrum silver 50 vitamin C 135 mg zinc   nitrofurantoin, macrocrystal-monohydrate, (MACROBID) 100 MG capsule Take 1 cap twice per day for 10 days.   ondansetron (ZOFRAN) 4 MG tablet Take 1 tablet (4 mg total) by mouth every 6 (six) hours as needed for nausea.   OVER THE COUNTER MEDICATION Calcium 750 mg and vitamin D 500 IU and vitamin K 40 mcg   oxyCODONE (ROXICODONE) 5 MG immediate release tablet Take 0.5 tablets (2.5 mg total) by mouth every 6 (six) hours as needed for breakthrough pain.   Phenylephrine-Acetaminophen (TYLENOL SINUS CONGESTION/PAIN PO) Take by mouth.   traMADol (ULTRAM) 50 MG  tablet Take 1 tablet (50 mg total) by mouth every 6 (six) hours as needed for moderate pain.   enoxaparin (LOVENOX) 40 MG/0.4ML injection Inject 0.4 mLs (40 mg total) into the skin daily for 14 days.   No facility-administered encounter medications on file as of 03/23/2023.      Medical History: Past Medical History:  Diagnosis Date   Allergy    Heart murmur      Vital Signs: BP 120/65   Pulse 87   Temp 98.2 F (36.8 C)   Resp 16   Ht 5\' 5"  (1.651 m)   Wt 148 lb (67.1 kg)   SpO2 97%   BMI 24.63 kg/m    Review of Systems  Constitutional:  Negative for fatigue and fever.  HENT:  Negative for congestion, mouth sores and postnasal drip.   Respiratory:  Negative for cough.   Cardiovascular:  Negative for chest pain.  Genitourinary:  Positive for dysuria, frequency and urgency.  Psychiatric/Behavioral: Negative.      Physical Exam Vitals and nursing note reviewed.  Constitutional:      Appearance: Normal appearance. She is normal weight.  HENT:     Head: Normocephalic and atraumatic.  Cardiovascular:     Rate and Rhythm: Normal rate and regular rhythm.  Pulmonary:     Effort: Pulmonary effort is normal.     Breath sounds: Normal breath sounds.  Skin:    General: Skin is warm and dry.  Neurological:     Mental Status: She is alert and oriented to person, place, and time.  Psychiatric:        Mood and Affect: Mood normal.        Behavior: Behavior normal.       Assessment/Plan: 1. Urinary tract infection with hematuria, site unspecified (Primary) Will start macrobid and adjust based on C/S, drink plenty of fluids - CULTURE, URINE COMPREHENSIVE - nitrofurantoin, macrocrystal-monohydrate, (MACROBID) 100 MG capsule; Take 1 cap twice per day for 10 days.  Dispense: 20 capsule; Refill: 0  2. Dysuria - POCT Urinalysis Dipstick   General Counseling: Tyleigh verbalizes understanding of the findings of todays visit and agrees with plan of treatment. I have  discussed any further diagnostic evaluation that may be needed or ordered today. We also reviewed her medications today. she has been encouraged to call the office with any questions or concerns that should arise related to todays visit.    Counseling:    Orders Placed This Encounter  Procedures   CULTURE, URINE COMPREHENSIVE   POCT Urinalysis Dipstick    Meds ordered this encounter  Medications   nitrofurantoin, macrocrystal-monohydrate, (MACROBID) 100 MG capsule    Sig: Take 1 cap twice per day for 10 days.    Dispense:  20 capsule    Refill:  0    Time spent:25 Minutes

## 2023-03-24 ENCOUNTER — Ambulatory Visit: Payer: Medicare Other | Admitting: Nurse Practitioner

## 2023-03-26 LAB — CULTURE, URINE COMPREHENSIVE

## 2023-04-09 ENCOUNTER — Ambulatory Visit (INDEPENDENT_AMBULATORY_CARE_PROVIDER_SITE_OTHER): Payer: Medicare Other | Admitting: Physician Assistant

## 2023-04-09 ENCOUNTER — Encounter: Payer: Self-pay | Admitting: Physician Assistant

## 2023-04-09 ENCOUNTER — Telehealth: Payer: Self-pay | Admitting: Physician Assistant

## 2023-04-09 VITALS — BP 138/70 | HR 67 | Temp 98.2°F | Resp 16 | Ht 65.0 in | Wt 147.2 lb

## 2023-04-09 DIAGNOSIS — E782 Mixed hyperlipidemia: Secondary | ICD-10-CM | POA: Diagnosis not present

## 2023-04-09 DIAGNOSIS — R946 Abnormal results of thyroid function studies: Secondary | ICD-10-CM

## 2023-04-09 DIAGNOSIS — R3914 Feeling of incomplete bladder emptying: Secondary | ICD-10-CM

## 2023-04-09 DIAGNOSIS — R5383 Other fatigue: Secondary | ICD-10-CM

## 2023-04-09 DIAGNOSIS — E538 Deficiency of other specified B group vitamins: Secondary | ICD-10-CM

## 2023-04-09 DIAGNOSIS — Z87440 Personal history of urinary (tract) infections: Secondary | ICD-10-CM

## 2023-04-09 DIAGNOSIS — E559 Vitamin D deficiency, unspecified: Secondary | ICD-10-CM | POA: Diagnosis not present

## 2023-04-09 NOTE — Telephone Encounter (Signed)
Urology referral sent via Epic to [facility]. Highlighted for patient on AVS-Toni

## 2023-04-09 NOTE — Progress Notes (Signed)
[facility] [address] [place], [place] [address]  Internal MEDICINE  Office Visit Note  Patient Name: Mary West  [id number]  [id number]  Date of Service: 04/15/2023  Chief Complaint  Patient presents with   Follow-up    HPI Pt is here for routine follow up -Fosamax 2019-2022 -Boniva 2023, will be due for bone density Sept 2025 and stopping Boniva  -Some burning/reflux occasionally, takes OTC acid reducer -incomplete bladder emptying sometimes, urgency but this has been better, will drip if unable to empty completely. This occurs outside of bladder infections. Also prone to recurrent UTI, though last culture only mixed flora. Discussed urology consult -will order labs for CPE  Current Medication: Outpatient Encounter Medications as of 04/09/2023  Medication Sig   acetaminophen (TYLENOL) 500 MG tablet Take 2 tablets (1,000 mg total) by mouth every 8 (eight) hours.   calcium carbonate (OS-CAL) 600 MG TABS tablet Take 750 mg by mouth daily.   calcium carbonate (TUMS - DOSED IN MG ELEMENTAL CALCIUM) 500 MG chewable tablet Chew 1 tablet by mouth daily.   docusate sodium (COLACE) 100 MG capsule Take 1 capsule (100 mg total) by mouth 2 (two) times daily.   ibandronate (BONIVA) 150 MG tablet TAKE 1 TABLET IN AM ONCE A MONTH ON EMPTY STOMACH. DO NOT TAKE ANYTHING ELSE BY MOUTH OR LIE DOWN FOR THE NEXT 30 MINUTES   Multiple Vitamins-Minerals (MULTIVITAMIN ADULTS PO) Take by mouth daily. Centrum silver 50 vitamin C 135 mg zinc   OVER THE COUNTER MEDICATION Calcium 750 mg and vitamin D 500 IU and vitamin K 40 mcg   oxyCODONE (ROXICODONE) 5 MG immediate release tablet Take 0.5 tablets (2.5 mg total) by mouth every 6 (six) hours as needed for breakthrough pain.   Phenylephrine-Acetaminophen (TYLENOL SINUS CONGESTION/PAIN PO) Take by mouth.   traMADol (ULTRAM) 50 MG tablet Take 1 tablet (50 mg total) by mouth every 6 (six) hours as needed for moderate pain.    [DISCONTINUED] famotidine (PEPCID) 20 MG tablet Take 20 mg by mouth 2 (two) times daily.   [DISCONTINUED] nitrofurantoin, macrocrystal-monohydrate, (MACROBID) 100 MG capsule Take 1 cap twice per day for 10 days.   [DISCONTINUED] ondansetron (ZOFRAN) 4 MG tablet Take 1 tablet (4 mg total) by mouth every 6 (six) hours as needed for nausea.   enoxaparin (LOVENOX) 40 MG/0.4ML injection Inject 0.4 mLs (40 mg total) into the skin daily for 14 days.   No facility-administered encounter medications on file as of 04/09/2023.    Surgical History: Past Surgical History:  Procedure Laterality Date   COLONOSCOPY     COLONOSCOPY WITH PROPOFOL N/A 12/17/2020   Procedure: COLONOSCOPY WITH PROPOFOL;  Surgeon: Midge Minium, MD;  Location: [facility];  Service: Endoscopy;  Laterality: N/A;   LEEP     TOTAL HIP ARTHROPLASTY Right 07/28/2022   Procedure: TOTAL HIP ARTHROPLASTY ANTERIOR APPROACH;  Surgeon: Reinaldo Berber, MD;  Location: [facility];  Service: Orthopedics;  Laterality: Right;    Medical History: Past Medical History:  Diagnosis Date   Allergy    Heart murmur     Family History: Family History  Problem Relation Age of Onset   Colon cancer Mother    Breast cancer Mother 37   COPD Father     Social History   Socioeconomic History   Marital status: Single    Spouse name: Not on file   Number of children: Not on file   Years of education: Not on file   Highest education  level: Not on file  Occupational History   Not on file  Tobacco Use   Smoking status: Never   Smokeless tobacco: Never  Vaping Use   Vaping status: Never Used  Substance and Sexual Activity   Alcohol use: Yes    Comment: occasionally    Drug use: Never   Sexual activity: Not on file  Other Topics Concern   Not on file  Social History Narrative   Not on file   Social Drivers of Health   Financial Resource Strain: Low Risk  (03/13/2023)   Received from [facility]   Overall  Financial Resource Strain (CARDIA)    Difficulty of Paying Living Expenses: Not hard at all  Food Insecurity: No Food Insecurity (03/13/2023)   Received from [facility]   Hunger Vital Sign    Worried About Running Out of Food in the Last Year: Never true    Ran Out of Food in the Last Year: Never true  Transportation Needs: No Transportation Needs (03/13/2023)   Received from [facility] - Transportation    In the past 12 months, has lack of transportation kept you from medical appointments or from getting medications?: No    Lack of Transportation (Non-Medical): No  Physical Activity: Not on file  Stress: Not on file  Social Connections: Not on file  Intimate Partner Violence: Not At Risk (07/28/2022)   Humiliation, Afraid, Rape, and Kick questionnaire    Fear of Current or Ex-Partner: No    Emotionally Abused: No    Physically Abused: No    Sexually Abused: No      Review of Systems  Constitutional:  Negative for fatigue and fever.  HENT:  Negative for congestion, mouth sores and postnasal drip.   Respiratory:  Negative for cough.   Cardiovascular:  Negative for chest pain.  Gastrointestinal:  Negative for diarrhea.  Genitourinary:  Positive for frequency and urgency.       Incomplete emptying  Musculoskeletal:  Negative for myalgias.  Neurological:  Negative for numbness.  Psychiatric/Behavioral: Negative.  Negative for behavioral problems.     Vital Signs: BP 138/70   Pulse 67   Temp 98.2 F (36.8 C)   Resp 16   Ht 5\' 5"  (1.651 m)   Wt 147 lb 3.2 oz (66.8 kg)   SpO2 99%   BMI 24.50 kg/m    Physical Exam Vitals and nursing note reviewed.  Constitutional:      General: She is not in acute distress.    Appearance: Normal appearance. She is normal weight. She is not ill-appearing.  HENT:     Head: Normocephalic and atraumatic.  Eyes:     Pupils: Pupils are equal, round, and reactive to light.  Cardiovascular:      Rate and Rhythm: Normal rate and regular rhythm.  Pulmonary:     Effort: Pulmonary effort is normal. No respiratory distress.  Neurological:     Mental Status: She is alert and oriented to person, place, and time.  Psychiatric:        Mood and Affect: Mood normal.        Behavior: Behavior normal.        Assessment/Plan: 1. History of recurrent UTIs (Primary) Will refer to urology - Ambulatory referral to Urology  2. Feeling of incomplete bladder emptying Will refer to urology - Ambulatory referral to Urology  3. Vitamin D deficiency - VITAMIN D 25 Hydroxy (Vit-D Deficiency, Fractures)  4. Abnormal thyroid exam - TSH + free T4  5. Mixed hyperlipidemia - Lipid Panel With LDL/HDL Ratio  6. B12 deficiency - B12 and Folate Panel  7. Other fatigue - CBC w/Diff/Platelet - Comprehensive metabolic panel - TSH + free T4 - Lipid Panel With LDL/HDL Ratio - VITAMIN D 25 Hydroxy (Vit-D Deficiency, Fractures) - B12 and Folate Panel   General Counseling: Mary West verbalizes understanding of the findings of todays visit and agrees with plan of treatment. I have discussed any further diagnostic evaluation that may be needed or ordered today. We also reviewed her medications today. she has been encouraged to call the office with any questions or concerns that should arise related to todays visit.    Orders Placed This Encounter  Procedures   CBC w/Diff/Platelet   Comprehensive metabolic panel   TSH + free T4   Lipid Panel With LDL/HDL Ratio   VITAMIN D 25 Hydroxy (Vit-D Deficiency, Fractures)   B12 and Folate Panel   Ambulatory referral to Urology    No orders of the defined types were placed in this encounter.   This patient was seen by Lynn Ito, PA-C in collaboration with Dr. Beverely Risen as a part of collaborative care agreement.   Total time spent:30 Minutes Time spent includes review of chart, medications, test results, and follow up plan with the patient.       Dr Lyndon Code Internal medicine

## 2023-04-13 DIAGNOSIS — K080 Exfoliation of teeth due to systemic causes: Secondary | ICD-10-CM | POA: Diagnosis not present

## 2023-04-14 DIAGNOSIS — H2513 Age-related nuclear cataract, bilateral: Secondary | ICD-10-CM | POA: Diagnosis not present

## 2023-04-14 DIAGNOSIS — H43813 Vitreous degeneration, bilateral: Secondary | ICD-10-CM | POA: Diagnosis not present

## 2023-05-05 DIAGNOSIS — K080 Exfoliation of teeth due to systemic causes: Secondary | ICD-10-CM | POA: Diagnosis not present

## 2023-05-13 ENCOUNTER — Ambulatory Visit: Admitting: Urology

## 2023-05-13 VITALS — BP 111/63 | HR 72 | Ht 65.0 in | Wt 145.6 lb

## 2023-05-13 DIAGNOSIS — Z87440 Personal history of urinary (tract) infections: Secondary | ICD-10-CM

## 2023-05-13 LAB — URINALYSIS, COMPLETE
Bilirubin, UA: NEGATIVE
Glucose, UA: NEGATIVE
Ketones, UA: NEGATIVE
Nitrite, UA: NEGATIVE
Protein,UA: NEGATIVE
Specific Gravity, UA: 1.01 (ref 1.005–1.030)
Urobilinogen, Ur: 0.2 mg/dL (ref 0.2–1.0)
pH, UA: 7 (ref 5.0–7.5)

## 2023-05-13 LAB — MICROSCOPIC EXAMINATION: Bacteria, UA: NONE SEEN

## 2023-05-13 MED ORDER — PREMARIN 0.625 MG/GM VA CREA: 1.0000 | TOPICAL_CREAM | Freq: Every day | VAGINAL | 12 refills | Status: DC

## 2023-05-13 MED ORDER — PREMARIN 0.625 MG/GM VA CREA
TOPICAL_CREAM | VAGINAL | 12 refills | Status: AC
Start: 2023-05-13 — End: ?

## 2023-05-13 NOTE — Patient Instructions (Signed)
For UTI prevention take cranberry tablets, probiotic, D-Mannose daily.  

## 2023-05-13 NOTE — Progress Notes (Signed)
 Marcelle Overlie Plume,acting as a scribe for Vanna Scotland, MD.,have documented all relevant documentation on the behalf of Vanna Scotland, MD,as directed by  Vanna Scotland, MD while in the presence of Vanna Scotland, MD.  05/13/23 11:07 AM   Mary West 1955-05-04 [id number]  Referring provider: Carlean Jews, PA-C [address] [place],  [place] [address]  Chief Complaint  Patient presents with   Recurrent UTI    HPI: 68 y/o female who presents today for further evaluation of presumed recurrent UTI. S  he has a history of several urinalyses over the past year that were negative, but a positive dipstick in 11/2022 showed urine growing pan-sensitive E. coli. A subsequent urinalysis in 01/2023 showed the same organism. In 02/2023, her urine was suspicious, but the culture grew mixed flora. She was referred to Urology for further evaluation. She has no recent cross-sectional or renal imaging.  She reports symptoms of a constant urge to urinate and pain at the end of urination, localized to the urethra. She denies pain in the vaginal or labial area. She has a history of UTIs during pregnancy and when younger, but currently only experiences the symptom of pain at the end of urination. She denies urgency and frequency typically associated with bladder inflammation. She is postmenopausal and has not been on any UTI prevention. She takes calcium and a multivitamin. She reports drinking a lot of water and has been consciously avoiding holding urine to prevent leakage.  Her PVR today is 15 mL.   PMH: Past Medical History:  Diagnosis Date   Allergy    Heart murmur     Surgical History: Past Surgical History:  Procedure Laterality Date   COLONOSCOPY     COLONOSCOPY WITH PROPOFOL N/A 12/17/2020   Procedure: COLONOSCOPY WITH PROPOFOL;  Surgeon: Midge Minium, MD;  Location: [facility];  Service: Endoscopy;  Laterality: N/A;   LEEP     TOTAL HIP ARTHROPLASTY Right  07/28/2022   Procedure: TOTAL HIP ARTHROPLASTY ANTERIOR APPROACH;  Surgeon: Reinaldo Berber, MD;  Location: [facility];  Service: Orthopedics;  Laterality: Right;    Home Medications:  Allergies as of 05/13/2023       Reactions   Tape Rash   Red skin        Medication List        Accurate as of May 13, 2023 11:07 AM. If you have any questions, ask your nurse or doctor.          STOP taking these medications    acetaminophen 500 MG tablet Commonly known as: TYLENOL   docusate sodium 100 MG capsule Commonly known as: COLACE   enoxaparin 40 MG/0.4ML injection Commonly known as: LOVENOX   oxyCODONE 5 MG immediate release tablet Commonly known as: Roxicodone   traMADol 50 MG tablet Commonly known as: ULTRAM       TAKE these medications    calcium carbonate 500 MG chewable tablet Commonly known as: TUMS - dosed in mg elemental calcium Chew 1 tablet by mouth daily.   calcium carbonate 600 MG Tabs tablet Commonly known as: OS-CAL Take 750 mg by mouth daily.   ibandronate 150 MG tablet Commonly known as: BONIVA TAKE 1 TABLET IN AM ONCE A MONTH ON EMPTY STOMACH. DO NOT TAKE ANYTHING ELSE BY MOUTH OR LIE DOWN FOR THE NEXT 30 MINUTES   MULTIVITAMIN ADULTS PO Take by mouth daily. Centrum silver 50 vitamin C 135 mg zinc   OVER THE COUNTER MEDICATION Calcium 750 mg  and vitamin D 500 IU and vitamin K 40 mcg   Premarin vaginal cream Generic drug: conjugated estrogens Estrogen Cream Instruction Discard applicator Apply pea sized amount to tip of finger to urethra before bed. Wash hands well after application. Use Monday, Wednesday and Friday   TYLENOL SINUS CONGESTION/PAIN PO Take by mouth.        Allergies:  Allergies  Allergen Reactions   Tape Rash    Red skin    Family History: Family History  Problem Relation Age of Onset   Colon cancer Mother    Breast cancer Mother 40   COPD Father     Social History:  reports that she has never smoked. She  has never used smokeless tobacco. She reports current alcohol use. She reports that she does not use drugs.   Physical Exam: BP 111/63   Pulse 72   Ht 5\' 5"  (1.651 m)   Wt 145 lb 9.6 oz (66 kg)   BMI 24.23 kg/m   Constitutional:  Alert and oriented, No acute distress. HEENT: [place] AT, moist mucus membranes.  Trachea midline, no masses. Neurologic: Grossly intact, no focal deficits, moving all 4 extremities. Psychiatric: Normal mood and affect.   Assessment & Plan:    # Recurrent Urinary Tract Infections (UTIs) - She is a 68 year old female with a history of presumed recurrent UTIs. - Previous urinalyses were negative, but a positive dip in October 2024 showed pan-sensitive E. coli. Subsequent cultures in December and January showed similar findings.  - She reports symptoms of pain at the end of urination, localized to the urethra, without significant urgency or frequency.  - Postmenopausal status is likely contributing to recurrent infections due to estrogen-sensitive tissue atrophy. - Initiate a regimen of supplements including probiotics, cranberry tablets, and D-mannose to prevent infections. - Recommend the use of topical estrogen cream applied to the urethra three times a week to improve tissue health and reduce infection risk.  - Educate her on proper application and precautions.  - Monitor for symptom improvement and recurrence of UTIs.  Recurrent UTI Prevention Strategies  Stay well hydrated. Get a moderate amount of exercise. Eat a diet rich in fruit and vegetables. Start a bowel regimen to manage your constipation. Your goal is to have consistent, formed bowel movements that are easy for you to pass. You may use either of the over-the-counter supplements Benefiber or Miralax to help with this. I recommend that you try Benefiber first and move on to Miralax if this is not helping you enough. You may adjust the recommended dose of Miralax (one capful daily) to achieve this  goal. Start taking an over-the-counter cranberry supplement for urinary tract health. Take this once or twice daily on an empty stomach, e.g. right before bed. Start taking an over-the-counter d-mannose supplement. Take this daily per packaging instructions. Start taking an over-the-counter probiotic containing the bacterial species called Lactobacillus. Take this daily. Start vaginal estrogen cream. Apply a pea-sized amount around the opening of the urethra every day for 2 weeks, then three times weekly forever.   # Bladder Emptying and Urinary Symptoms - Her PVR is 15 mL, indicating effective bladder emptying.  - She reports occasional leakage if unable to reach the bathroom promptly, but no significant overactive bladder symptoms. - Encourage her to continue her strategy of frequent voiding to prevent leakage.  - Discuss potential treatment options for overactive bladder if symptoms worsen or affect quality of life.  F/u prn  I have reviewed the above documentation  for accuracy and completeness, and I agree with the above.   Vanna Scotland, MD    [facility] [address] [place], [place] [address] [phone]

## 2023-05-22 ENCOUNTER — Ambulatory Visit: Payer: Medicare Other | Admitting: Urology

## 2023-08-18 DIAGNOSIS — Z96641 Presence of right artificial hip joint: Secondary | ICD-10-CM | POA: Diagnosis not present

## 2023-09-05 ENCOUNTER — Other Ambulatory Visit: Payer: Self-pay | Admitting: Internal Medicine

## 2023-09-05 DIAGNOSIS — M810 Age-related osteoporosis without current pathological fracture: Secondary | ICD-10-CM

## 2023-09-07 NOTE — Telephone Encounter (Signed)
 Please review

## 2023-09-14 ENCOUNTER — Encounter: Payer: Self-pay | Admitting: Physician Assistant

## 2023-09-14 ENCOUNTER — Ambulatory Visit: Admitting: Physician Assistant

## 2023-09-14 VITALS — BP 98/66

## 2023-09-14 DIAGNOSIS — L821 Other seborrheic keratosis: Secondary | ICD-10-CM

## 2023-09-14 NOTE — Patient Instructions (Signed)

## 2023-09-14 NOTE — Progress Notes (Signed)
   New Patient Visit   Subjective  Mary West is a 68 y.o. female who presents for the following: Spots of back and left forearm that are new. No history of skin cancer.  Previous patient at [facility] in [place], [place].   Is not due for full skin exam until October.   Has had a mole surgically excised on left chest that was abnormal.     The following portions of the chart were reviewed this encounter and updated as appropriate: medications, allergies, medical history  Review of Systems:  No other skin or systemic complaints except as noted in HPI or Assessment and Plan.  Objective  Well appearing patient in no apparent distress; mood and affect are within normal limits.   A focused examination was performed of the following areas: back and arms.    Relevant exam findings are noted in the Assessment and Plan.    Assessment & Plan   SEBORRHEIC KERATOSIS - back and arm - Stuck-on, waxy, tan-brown papules and/or plaques  - Benign-appearing - Discussed benign etiology and prognosis. - Observe - Call for any changes   Recommend scheduling full body skin exam for October.    SEBORRHEIC KERATOSIS    Return in about 3 months (around 12/15/2023) for TBSE.  I, Roseline Hutchinson, CMA, am acting as scribe for Oddie Bottger K, PA-C .   Documentation: I have reviewed the above documentation for accuracy and completeness, and I agree with the above.  Shine Scrogham K, PA-C

## 2023-09-16 DIAGNOSIS — M7502 Adhesive capsulitis of left shoulder: Secondary | ICD-10-CM | POA: Diagnosis not present

## 2023-10-06 DIAGNOSIS — E559 Vitamin D deficiency, unspecified: Secondary | ICD-10-CM | POA: Diagnosis not present

## 2023-10-06 DIAGNOSIS — R946 Abnormal results of thyroid function studies: Secondary | ICD-10-CM | POA: Diagnosis not present

## 2023-10-06 DIAGNOSIS — E782 Mixed hyperlipidemia: Secondary | ICD-10-CM | POA: Diagnosis not present

## 2023-10-06 DIAGNOSIS — R5383 Other fatigue: Secondary | ICD-10-CM | POA: Diagnosis not present

## 2023-10-07 ENCOUNTER — Ambulatory Visit: Payer: Self-pay | Admitting: Physician Assistant

## 2023-10-07 LAB — CBC WITH DIFFERENTIAL/PLATELET
Basophils Absolute: 0 x10E3/uL (ref 0.0–0.2)
Basos: 1 %
EOS (ABSOLUTE): 0.1 x10E3/uL (ref 0.0–0.4)
Eos: 2 %
Hematocrit: 43.9 % (ref 34.0–46.6)
Hemoglobin: 14.3 g/dL (ref 11.1–15.9)
Immature Grans (Abs): 0 x10E3/uL (ref 0.0–0.1)
Immature Granulocytes: 0 %
Lymphocytes Absolute: 0.9 x10E3/uL (ref 0.7–3.1)
Lymphs: 24 %
MCH: 32.1 pg (ref 26.6–33.0)
MCHC: 32.6 g/dL (ref 31.5–35.7)
MCV: 99 fL — ABNORMAL HIGH (ref 79–97)
Monocytes Absolute: 0.4 x10E3/uL (ref 0.1–0.9)
Monocytes: 9 %
Neutrophils Absolute: 2.5 x10E3/uL (ref 1.4–7.0)
Neutrophils: 64 %
Platelets: 302 x10E3/uL (ref 150–450)
RBC: 4.45 x10E6/uL (ref 3.77–5.28)
RDW: 11.8 % (ref 11.7–15.4)
WBC: 3.9 x10E3/uL (ref 3.4–10.8)

## 2023-10-07 LAB — COMPREHENSIVE METABOLIC PANEL WITH GFR
ALT: 22 IU/L (ref 0–32)
AST: 26 IU/L (ref 0–40)
Albumin: 4.4 g/dL (ref 3.9–4.9)
Alkaline Phosphatase: 87 IU/L (ref 44–121)
BUN/Creatinine Ratio: 11 — ABNORMAL LOW (ref 12–28)
BUN: 10 mg/dL (ref 8–27)
Bilirubin Total: 0.7 mg/dL (ref 0.0–1.2)
CO2: 21 mmol/L (ref 20–29)
Calcium: 9.8 mg/dL (ref 8.7–10.3)
Chloride: 103 mmol/L (ref 96–106)
Creatinine, Ser: 0.87 mg/dL (ref 0.57–1.00)
Globulin, Total: 2.4 g/dL (ref 1.5–4.5)
Glucose: 85 mg/dL (ref 70–99)
Potassium: 4.5 mmol/L (ref 3.5–5.2)
Sodium: 140 mmol/L (ref 134–144)
Total Protein: 6.8 g/dL (ref 6.0–8.5)
eGFR: 73 mL/min/1.73 (ref >59)

## 2023-10-07 LAB — TSH+FREE T4
Free T4: 1.18 ng/dL (ref 0.82–1.77)
TSH: 1.55 u[IU]/mL (ref 0.450–4.500)

## 2023-10-07 LAB — B12 AND FOLATE PANEL
Folate: 16.7 ng/mL (ref >3.0)
Vitamin B-12: 434 pg/mL (ref 232–1245)

## 2023-10-07 LAB — VITAMIN D 25 HYDROXY (VIT D DEFICIENCY, FRACTURES): Vit D, 25-Hydroxy: 37.3 ng/mL (ref 30.0–100.0)

## 2023-10-07 LAB — LIPID PANEL WITH LDL/HDL RATIO
Cholesterol, Total: 202 mg/dL — ABNORMAL HIGH (ref 100–199)
HDL: 58 mg/dL (ref >39)
LDL Chol Calc (NIH): 127 mg/dL — ABNORMAL HIGH (ref 0–99)
LDL/HDL Ratio: 2.2 ratio (ref 0.0–3.2)
Triglycerides: 97 mg/dL (ref 0–149)
VLDL Cholesterol Cal: 17 mg/dL (ref 5–40)

## 2023-10-12 ENCOUNTER — Ambulatory Visit: Payer: Medicare Other | Admitting: Physician Assistant

## 2023-10-13 DIAGNOSIS — K080 Exfoliation of teeth due to systemic causes: Secondary | ICD-10-CM | POA: Diagnosis not present

## 2023-10-15 ENCOUNTER — Ambulatory Visit (INDEPENDENT_AMBULATORY_CARE_PROVIDER_SITE_OTHER): Payer: Medicare Other | Admitting: Physician Assistant

## 2023-10-15 ENCOUNTER — Encounter: Payer: Self-pay | Admitting: Physician Assistant

## 2023-10-15 VITALS — BP 112/68 | HR 73 | Temp 98.3°F | Resp 16 | Ht 65.0 in | Wt 142.0 lb

## 2023-10-15 DIAGNOSIS — Z0000 Encounter for general adult medical examination without abnormal findings: Secondary | ICD-10-CM | POA: Diagnosis not present

## 2023-10-15 DIAGNOSIS — Z1231 Encounter for screening mammogram for malignant neoplasm of breast: Secondary | ICD-10-CM

## 2023-10-15 DIAGNOSIS — M810 Age-related osteoporosis without current pathological fracture: Secondary | ICD-10-CM | POA: Diagnosis not present

## 2023-10-15 DIAGNOSIS — E782 Mixed hyperlipidemia: Secondary | ICD-10-CM | POA: Diagnosis not present

## 2023-10-15 NOTE — Progress Notes (Signed)
 [facility] [address] [place], [place] [address]  Internal MEDICINE  Office Visit Note  Patient Name: Mary West  [id number]  [id number]  Date of Service: 10/15/2023  Chief Complaint  Patient presents with   Medicare Wellness    HPI Mary West presents for an annual well visit Well-appearing 68 y.o.female Routine CRC screening: UTD, due in 2032 Routine mammogram: Due in Sept DEXA scan: has been off of Boniva  for last 2 months. Total of 5 years treatment now. Due for repeat DEXA in Sept with mammogram Eye exam: eye exam in Feb. Labs: cholesterol elevated and will work on adjusting diet and reducing fried foods. Admits to eating [place] on sandwiches more recently New or worsening pain: Has gotten 2 injections in shoulder. Annual follow up since hip replacement went well Other concerns: Did see urology and states this was a good visit, but provider is leaving. No issues since visit though     10/15/2023    9:04 AM 10/06/2022   10:41 AM 09/19/2021    8:56 AM  MMSE - Mini Mental State Exam  Orientation to time 5 5 5   Orientation to Place 5 5 5   Registration 3 3 3   Attention/ Calculation 5 5 5   Recall 3 3 3   Language- name 2 objects 2 2 2   Language- repeat 1 1 1   Language- follow 3 step command 3 3 3   Language- read & follow direction 1 1 1   Write a sentence 1 1 1   Copy design 1 1 1   Total score 30 30 30     Functional Status Survey: Is the patient deaf or have difficulty hearing?: No Does the patient have difficulty seeing, even when wearing glasses/contacts?: No Does the patient have difficulty concentrating, remembering, or making decisions?: No Does the patient have difficulty walking or climbing stairs?: No Does the patient have difficulty dressing or bathing?: No Does the patient have difficulty doing errands alone such as visiting a doctor's office or shopping?: No     01/30/2022   11:18 AM 07/14/2022    9:34 AM 10/06/2022   10:41 AM 04/09/2023     1:25 PM 10/15/2023    9:04 AM  Fall Risk  Falls in the past year? 0 0 0 0 0       10/15/2023    9:04 AM  Depression screen PHQ 2/9  Decreased Interest 0  Down, Depressed, Hopeless 0  PHQ - 2 Score 0        No data to display            Current Medication: Outpatient Encounter Medications as of 10/15/2023  Medication Sig   calcium carbonate (OS-CAL) 600 MG TABS tablet Take 750 mg by mouth daily.   calcium carbonate (TUMS - DOSED IN MG ELEMENTAL CALCIUM) 500 MG chewable tablet Chew 1 tablet by mouth daily.   conjugated estrogens  (PREMARIN ) vaginal cream Estrogen Cream Instruction Discard applicator Apply pea sized amount to tip of finger to urethra before bed. Wash hands well after application. Use Monday, Wednesday and Friday   Lactobacillus (AZO VAGINAL HEALTH PROBIOTIC PO) Take by mouth daily at 6 (six) AM.   Multiple Vitamins-Minerals (MULTIVITAMIN ADULTS PO) Take by mouth daily. Centrum silver 50 vitamin C 135 mg zinc   OVER THE COUNTER MEDICATION Calcium 750 mg and vitamin D  500 IU and vitamin K 40 mcg   Phenylephrine -Acetaminophen  (TYLENOL  SINUS CONGESTION/PAIN PO) Take by mouth.   [DISCONTINUED] ibandronate  (BONIVA ) 150 MG tablet TAKE 1 TABLET BY  MOUTH ONE DAY A MONTH ON AN EMPTY STOMACH. DO NOT TAKE ANYTHING ELSE BY MOUTH OR LIE DOWN FOR THE NEXT 30 MINUTES   No facility-administered encounter medications on file as of 10/15/2023.    Surgical History: Past Surgical History:  Procedure Laterality Date   COLONOSCOPY     COLONOSCOPY WITH PROPOFOL  N/A 12/17/2020   Procedure: COLONOSCOPY WITH PROPOFOL ;  Surgeon: Jinny Carmine, MD;  Location: [facility];  Service: Endoscopy;  Laterality: N/A;   LEEP     TOTAL HIP ARTHROPLASTY Right 07/28/2022   Procedure: TOTAL HIP ARTHROPLASTY ANTERIOR APPROACH;  Surgeon: Lorelle Hussar, MD;  Location: [facility];  Service: Orthopedics;  Laterality: Right;    Medical History: Past Medical History:  Diagnosis Date   Allergy     Heart murmur     Family History: Family History  Problem Relation Age of Onset   Colon cancer Mother    Breast cancer Mother 3   COPD Father     Social History   Socioeconomic History   Marital status: Single    Spouse name: Not on file   Number of children: Not on file   Years of education: Not on file   Highest education level: Not on file  Occupational History   Not on file  Tobacco Use   Smoking status: Never   Smokeless tobacco: Never  Vaping Use   Vaping status: Never Used  Substance and Sexual Activity   Alcohol use: Yes    Comment: occasionally    Drug use: Never   Sexual activity: Not on file  Other Topics Concern   Not on file  Social History Narrative   Not on file   Social Drivers of Health   Financial Resource Strain: Low Risk  (03/13/2023)   Received from [facility]   Overall Financial Resource Strain (CARDIA)    Difficulty of Paying Living Expenses: Not hard at all  Food Insecurity: No Food Insecurity (03/13/2023)   Received from [facility]   Hunger Vital Sign    Within the past 12 months, you worried that your food would run out before you got the money to buy more.: Never true    Within the past 12 months, the food you bought just didn't last and you didn't have money to get more.: Never true  Transportation Needs: No Transportation Needs (03/13/2023)   Received from [facility] - Transportation    In the past 12 months, has lack of transportation kept you from medical appointments or from getting medications?: No    Lack of Transportation (Non-Medical): No  Physical Activity: Not on file  Stress: Not on file  Social Connections: Not on file  Intimate Partner Violence: Not At Risk (07/28/2022)   Humiliation, Afraid, Rape, and Kick questionnaire    Fear of Current or Ex-Partner: No    Emotionally Abused: No    Physically Abused: No    Sexually Abused: No      Review of  Systems  Constitutional:  Negative for chills, fatigue and unexpected weight change.  HENT:  Negative for congestion, postnasal drip, rhinorrhea, sneezing and sore throat.   Eyes:  Negative for redness.  Respiratory:  Negative for cough, chest tightness and shortness of breath.   Cardiovascular:  Negative for chest pain and palpitations.  Gastrointestinal:  Negative for abdominal pain, constipation, diarrhea, nausea and vomiting.  Genitourinary:  Negative for dysuria and frequency.  Musculoskeletal:  Negative for joint  swelling and neck pain.  Skin:  Negative for rash.  Neurological: Negative.  Negative for tremors and numbness.  Hematological:  Negative for adenopathy. Does not bruise/bleed easily.  Psychiatric/Behavioral:  Negative for behavioral problems (Depression), sleep disturbance and suicidal ideas. The patient is not nervous/anxious.     Vital Signs: BP 112/68   Pulse 73   Temp 98.3 F (36.8 C)   Resp 16   Ht 5' 5 (1.651 m)   Wt 142 lb (64.4 kg)   SpO2 99%   BMI 23.63 kg/m    Physical Exam Vitals and nursing note reviewed.  Constitutional:      General: She is not in acute distress.    Appearance: Normal appearance. She is normal weight. She is not ill-appearing.  HENT:     Head: Normocephalic and atraumatic.  Eyes:     Extraocular Movements: Extraocular movements intact.  Cardiovascular:     Rate and Rhythm: Normal rate and regular rhythm.  Pulmonary:     Effort: Pulmonary effort is normal. No respiratory distress.  Abdominal:     General: Bowel sounds are normal.  Skin:    General: Skin is warm and dry.  Neurological:     Mental Status: She is alert and oriented to person, place, and time.  Psychiatric:        Mood and Affect: Mood normal.        Behavior: Behavior normal.        Assessment/Plan: 1. Encounter for annual wellness exam in Medicare patient (Primary) AWV performed, labs reviewed, due for mammogram and bone density in Sept  2.  Senile osteoporosis Has completed 5 years of bisphosphonate treatment and will update bone density. Continue to take calcium and vitamin D  and stay active - DG Bone Density; Future  3. Visit for screening mammogram - MM 3D SCREENING MAMMOGRAM BILATERAL BREAST; Future  4. Mixed hyperlipidemia LDL rising, patient advised to improve diet and avoid fried/fatty foods. She will work on this    General Counseling: Jovanna verbalizes understanding of the findings of todays visit and agrees with plan of treatment. I have discussed any further diagnostic evaluation that may be needed or ordered today. We also reviewed her medications today. she has been encouraged to call the office with any questions or concerns that should arise related to todays visit.    Orders Placed This Encounter  Procedures   DG Bone Density   MM 3D SCREENING MAMMOGRAM BILATERAL BREAST    No orders of the defined types were placed in this encounter.   Return in about 1 year (around 10/14/2024) for CPE.   Total time spent:35 Minutes Time spent includes review of chart, medications, test results, and follow up plan with the patient.   [place] Controlled Substance Database was reviewed by me.  This patient was seen by Tinnie Pro, PA-C in collaboration with Dr. Sigrid Bathe as a part of collaborative care agreement.  Tinnie Pro, PA-C Internal medicine

## 2023-11-19 ENCOUNTER — Ambulatory Visit
Admission: RE | Admit: 2023-11-19 | Discharge: 2023-11-19 | Disposition: A | Discharge status: Home / Self Care | Source: Ambulatory Visit | Attending: Physician Assistant | Admitting: Physician Assistant

## 2023-11-19 DIAGNOSIS — Z1231 Encounter for screening mammogram for malignant neoplasm of breast: Secondary | ICD-10-CM

## 2023-11-19 DIAGNOSIS — M810 Age-related osteoporosis without current pathological fracture: Secondary | ICD-10-CM | POA: Diagnosis not present

## 2023-11-19 DIAGNOSIS — M8589 Other specified disorders of bone density and structure, multiple sites: Secondary | ICD-10-CM | POA: Diagnosis not present

## 2023-11-19 DIAGNOSIS — Z780 Asymptomatic menopausal state: Secondary | ICD-10-CM | POA: Diagnosis not present

## 2023-12-08 ENCOUNTER — Encounter: Payer: Self-pay | Admitting: Physician Assistant

## 2023-12-15 ENCOUNTER — Ambulatory Visit: Admitting: Physician Assistant

## 2024-10-17 ENCOUNTER — Ambulatory Visit: Admitting: Physician Assistant

## 2024-10-20 ENCOUNTER — Ambulatory Visit: Admitting: Physician Assistant
